# Patient Record
Sex: Male | Born: 1965 | Hispanic: Yes | Marital: Married | Smoking: Never smoker
Health system: Southern US, Community
[De-identification: ages and names within clinical notes are randomized; demographics above are authoritative.]

## PROBLEM LIST (undated history)

## (undated) DIAGNOSIS — Z86010 Personal history of colonic polyps: Principal | ICD-10-CM

## (undated) DIAGNOSIS — T7840XA Allergy, unspecified, initial encounter: Secondary | ICD-10-CM

## (undated) DIAGNOSIS — J45909 Unspecified asthma, uncomplicated: Secondary | ICD-10-CM

## (undated) HISTORY — DX: Allergy, unspecified, initial encounter: T78.40XA

## (undated) HISTORY — DX: Unspecified asthma, uncomplicated: J45.909

## (undated) HISTORY — PX: TONSILLECTOMY: SUR1361

## (undated) HISTORY — DX: Personal history of colonic polyps: Z86.010

## (undated) HISTORY — PX: HERNIA REPAIR: SHX51

## (undated) HISTORY — PX: ANKLE SURGERY: SHX546

## (undated) HISTORY — PX: KNEE SURGERY: SHX244

---

## 2015-10-08 DIAGNOSIS — Z23 Encounter for immunization: Secondary | ICD-10-CM | POA: Diagnosis not present

## 2015-10-08 DIAGNOSIS — Z136 Encounter for screening for cardiovascular disorders: Secondary | ICD-10-CM | POA: Diagnosis not present

## 2015-10-08 DIAGNOSIS — Z125 Encounter for screening for malignant neoplasm of prostate: Secondary | ICD-10-CM | POA: Diagnosis not present

## 2015-10-08 DIAGNOSIS — Z0000 Encounter for general adult medical examination without abnormal findings: Secondary | ICD-10-CM | POA: Diagnosis not present

## 2015-12-17 DIAGNOSIS — Z23 Encounter for immunization: Secondary | ICD-10-CM | POA: Diagnosis not present

## 2016-03-11 DIAGNOSIS — M25552 Pain in left hip: Secondary | ICD-10-CM | POA: Diagnosis not present

## 2016-03-11 DIAGNOSIS — Z6831 Body mass index (BMI) 31.0-31.9, adult: Secondary | ICD-10-CM | POA: Diagnosis not present

## 2016-08-04 DIAGNOSIS — M7702 Medial epicondylitis, left elbow: Secondary | ICD-10-CM | POA: Diagnosis not present

## 2016-08-04 DIAGNOSIS — M7541 Impingement syndrome of right shoulder: Secondary | ICD-10-CM | POA: Diagnosis not present

## 2016-08-04 DIAGNOSIS — Z6831 Body mass index (BMI) 31.0-31.9, adult: Secondary | ICD-10-CM | POA: Diagnosis not present

## 2016-08-11 DIAGNOSIS — M9902 Segmental and somatic dysfunction of thoracic region: Secondary | ICD-10-CM | POA: Diagnosis not present

## 2016-08-11 DIAGNOSIS — M9907 Segmental and somatic dysfunction of upper extremity: Secondary | ICD-10-CM | POA: Diagnosis not present

## 2016-08-11 DIAGNOSIS — M9901 Segmental and somatic dysfunction of cervical region: Secondary | ICD-10-CM | POA: Diagnosis not present

## 2016-08-11 DIAGNOSIS — M7542 Impingement syndrome of left shoulder: Secondary | ICD-10-CM | POA: Diagnosis not present

## 2016-08-15 DIAGNOSIS — M7542 Impingement syndrome of left shoulder: Secondary | ICD-10-CM | POA: Diagnosis not present

## 2016-08-15 DIAGNOSIS — M9901 Segmental and somatic dysfunction of cervical region: Secondary | ICD-10-CM | POA: Diagnosis not present

## 2016-08-15 DIAGNOSIS — M9902 Segmental and somatic dysfunction of thoracic region: Secondary | ICD-10-CM | POA: Diagnosis not present

## 2016-08-15 DIAGNOSIS — M9907 Segmental and somatic dysfunction of upper extremity: Secondary | ICD-10-CM | POA: Diagnosis not present

## 2016-08-17 DIAGNOSIS — M7542 Impingement syndrome of left shoulder: Secondary | ICD-10-CM | POA: Diagnosis not present

## 2016-08-17 DIAGNOSIS — M9902 Segmental and somatic dysfunction of thoracic region: Secondary | ICD-10-CM | POA: Diagnosis not present

## 2016-08-17 DIAGNOSIS — M9901 Segmental and somatic dysfunction of cervical region: Secondary | ICD-10-CM | POA: Diagnosis not present

## 2016-08-17 DIAGNOSIS — M9907 Segmental and somatic dysfunction of upper extremity: Secondary | ICD-10-CM | POA: Diagnosis not present

## 2016-08-21 DIAGNOSIS — M7542 Impingement syndrome of left shoulder: Secondary | ICD-10-CM | POA: Diagnosis not present

## 2016-08-21 DIAGNOSIS — M9907 Segmental and somatic dysfunction of upper extremity: Secondary | ICD-10-CM | POA: Diagnosis not present

## 2016-08-21 DIAGNOSIS — M9901 Segmental and somatic dysfunction of cervical region: Secondary | ICD-10-CM | POA: Diagnosis not present

## 2016-08-21 DIAGNOSIS — M9902 Segmental and somatic dysfunction of thoracic region: Secondary | ICD-10-CM | POA: Diagnosis not present

## 2016-10-01 DIAGNOSIS — M9901 Segmental and somatic dysfunction of cervical region: Secondary | ICD-10-CM | POA: Diagnosis not present

## 2016-10-01 DIAGNOSIS — M9907 Segmental and somatic dysfunction of upper extremity: Secondary | ICD-10-CM | POA: Diagnosis not present

## 2016-10-01 DIAGNOSIS — M9902 Segmental and somatic dysfunction of thoracic region: Secondary | ICD-10-CM | POA: Diagnosis not present

## 2016-10-01 DIAGNOSIS — M7542 Impingement syndrome of left shoulder: Secondary | ICD-10-CM | POA: Diagnosis not present

## 2016-10-06 DIAGNOSIS — M9907 Segmental and somatic dysfunction of upper extremity: Secondary | ICD-10-CM | POA: Diagnosis not present

## 2016-10-06 DIAGNOSIS — M9902 Segmental and somatic dysfunction of thoracic region: Secondary | ICD-10-CM | POA: Diagnosis not present

## 2016-10-06 DIAGNOSIS — M9901 Segmental and somatic dysfunction of cervical region: Secondary | ICD-10-CM | POA: Diagnosis not present

## 2016-10-06 DIAGNOSIS — M7542 Impingement syndrome of left shoulder: Secondary | ICD-10-CM | POA: Diagnosis not present

## 2016-10-07 DIAGNOSIS — M9907 Segmental and somatic dysfunction of upper extremity: Secondary | ICD-10-CM | POA: Diagnosis not present

## 2016-10-07 DIAGNOSIS — M9902 Segmental and somatic dysfunction of thoracic region: Secondary | ICD-10-CM | POA: Diagnosis not present

## 2016-10-07 DIAGNOSIS — M7542 Impingement syndrome of left shoulder: Secondary | ICD-10-CM | POA: Diagnosis not present

## 2016-10-07 DIAGNOSIS — M9901 Segmental and somatic dysfunction of cervical region: Secondary | ICD-10-CM | POA: Diagnosis not present

## 2017-02-23 DIAGNOSIS — Z125 Encounter for screening for malignant neoplasm of prostate: Secondary | ICD-10-CM | POA: Diagnosis not present

## 2017-02-23 DIAGNOSIS — Z114 Encounter for screening for human immunodeficiency virus [HIV]: Secondary | ICD-10-CM | POA: Diagnosis not present

## 2017-02-23 DIAGNOSIS — Z0000 Encounter for general adult medical examination without abnormal findings: Secondary | ICD-10-CM | POA: Diagnosis not present

## 2017-02-23 DIAGNOSIS — Z23 Encounter for immunization: Secondary | ICD-10-CM | POA: Diagnosis not present

## 2017-02-23 DIAGNOSIS — Z1329 Encounter for screening for other suspected endocrine disorder: Secondary | ICD-10-CM | POA: Diagnosis not present

## 2017-02-23 DIAGNOSIS — Z13220 Encounter for screening for lipoid disorders: Secondary | ICD-10-CM | POA: Diagnosis not present

## 2017-02-23 DIAGNOSIS — Z6832 Body mass index (BMI) 32.0-32.9, adult: Secondary | ICD-10-CM | POA: Diagnosis not present

## 2017-02-23 DIAGNOSIS — Z1211 Encounter for screening for malignant neoplasm of colon: Secondary | ICD-10-CM | POA: Diagnosis not present

## 2017-04-08 ENCOUNTER — Encounter: Payer: Self-pay | Admitting: Internal Medicine

## 2017-06-10 ENCOUNTER — Encounter: Payer: Self-pay | Admitting: Internal Medicine

## 2017-06-30 ENCOUNTER — Encounter: Payer: Self-pay | Admitting: *Deleted

## 2017-07-15 ENCOUNTER — Telehealth: Payer: Self-pay | Admitting: *Deleted

## 2017-07-15 NOTE — Telephone Encounter (Signed)
Called patient and he had forgotten about his Pre visit appointment this morning.  I was going to reschedule him but he wanted to call back in 2 hours to reschedule.  He will need to reschedule pre visit appointment before April 18th or both appointments will need to be rescheduled.  If patient has not called back before 5 pm today, I will cancel both appointments.  I gave him [phone] number to call.  B.Lorianna Spadaccini, CMA  PV

## 2017-07-16 ENCOUNTER — Encounter: Payer: Self-pay | Admitting: Family Medicine

## 2017-07-29 ENCOUNTER — Encounter: Payer: Self-pay | Admitting: Internal Medicine

## 2018-02-23 ENCOUNTER — Encounter: Payer: Self-pay | Admitting: Internal Medicine

## 2018-03-09 ENCOUNTER — Ambulatory Visit (AMBULATORY_SURGERY_CENTER): Payer: Self-pay

## 2018-03-09 VITALS — Ht 70.0 in | Wt 228.0 lb

## 2018-03-09 DIAGNOSIS — Z1211 Encounter for screening for malignant neoplasm of colon: Secondary | ICD-10-CM

## 2018-03-09 NOTE — Progress Notes (Signed)
Denies allergies to eggs or soy products. Denies complication of anesthesia or sedation. Denies use of weight loss medication. Denies use of O2.   Emmi instructions declined.  

## 2018-03-14 ENCOUNTER — Encounter: Payer: Self-pay | Admitting: Internal Medicine

## 2018-03-28 ENCOUNTER — Encounter: Payer: Self-pay | Admitting: Internal Medicine

## 2018-03-28 ENCOUNTER — Ambulatory Visit (AMBULATORY_SURGERY_CENTER): Payer: BLUE CROSS/BLUE SHIELD | Admitting: Internal Medicine

## 2018-03-28 VITALS — BP 109/76 | HR 58 | Temp 98.2°F | Resp 16 | Ht 70.0 in | Wt 225.0 lb

## 2018-03-28 DIAGNOSIS — Z1211 Encounter for screening for malignant neoplasm of colon: Secondary | ICD-10-CM | POA: Diagnosis not present

## 2018-03-28 DIAGNOSIS — D124 Benign neoplasm of descending colon: Secondary | ICD-10-CM

## 2018-03-28 MED ORDER — SODIUM CHLORIDE 0.9 % IV SOLN: 500.0000 mL | Freq: Once | INTRAVENOUS | Status: DC

## 2018-03-28 NOTE — Progress Notes (Signed)
Called to room to assist during endoscopic procedure.  Patient ID and intended procedure confirmed with present staff. Received instructions for my participation in the procedure from the performing physician.  

## 2018-03-28 NOTE — Op Note (Addendum)
[place] Patient Name: Edward Weiss Procedure Date: 03/28/2018 2:43 PM MRN: [id number] Endoscopist: Gatha Mayer , MD Age: 52 Referring MD:  Date of Birth: Feb 27, 1966 Gender: Male Account #: [id number] Procedure:                Colonoscopy Indications:              Screening for colorectal malignant neoplasm, This                            is the patient's first colonoscopy Medicines:                Propofol per Anesthesia, Monitored Anesthesia Care Procedure:                Pre-Anesthesia Assessment:                           - Prior to the procedure, a History and Physical                            was performed, and patient medications and                            allergies were reviewed. The patient's tolerance of                            previous anesthesia was also reviewed. The risks                            and benefits of the procedure and the sedation                            options and risks were discussed with the patient.                            All questions were answered, and informed consent                            was obtained. Prior Anticoagulants: The patient has                            taken no previous anticoagulant or antiplatelet                            agents. ASA Grade Assessment: I - A normal, healthy                            patient. After reviewing the risks and benefits,                            the patient was deemed in satisfactory condition to                            undergo the procedure.  After obtaining informed consent, the colonoscope                            was passed under direct vision. Throughout the                            procedure, the patient's blood pressure, pulse, and                            oxygen saturations were monitored continuously. The                            Colonoscope was introduced through the anus and                            advanced to the  the cecum, identified by                            appendiceal orifice and ileocecal valve. The                            colonoscopy was performed without difficulty. The                            patient tolerated the procedure well. The quality                            of the bowel preparation was good. The appendiceal                            orifice and the rectum were photographed. The bowel                            preparation used was Miralax. Scope In: 2:50:33 PM Scope Out: 3:09:08 PM Scope Withdrawal Time: 0 hours 14 minutes 25 seconds  Total Procedure Duration: 0 hours 18 minutes 35 seconds  Findings:                 The perianal and digital rectal examinations were                            normal. Pertinent negatives include normal prostate                            (size, shape, and consistency).                           Two sessile polyps were found in the descending                            colon. The polyps were diminutive in size. These                            polyps were removed with a cold snare. Resection  and retrieval were complete. Verification of                            patient identification for the specimen was done.                            Estimated blood loss was minimal.                           The exam was otherwise without abnormality on                            direct and retroflexion views. Complications:            No immediate complications. Estimated Blood Loss:     Estimated blood loss was minimal. Impression:               - Two diminutive polyps in the descending colon,                            removed with a cold snare. Resected and retrieved.                           - The examination was otherwise normal on direct                            and retroflexion views. Recommendation:           - Patient has a contact number available for                            emergencies. The signs and  symptoms of potential                            delayed complications were discussed with the                            patient. Return to normal activities tomorrow.                            Written discharge instructions were provided to the                            patient.                           - Resume previous diet.                           - Continue present medications.                           - Repeat colonoscopy is recommended. The                            colonoscopy date will be determined after pathology  results from today's exam become available for                            review. Gatha Mayer, MD 03/28/2018 3:13:45 PM This report has been signed electronically.

## 2018-03-28 NOTE — Patient Instructions (Addendum)
   I found and removed 2 tiny polyps.  I will let you know pathology results and when to have another routine colonoscopy by mail and/or My Chart.  I appreciate the opportunity to care for you. Gatha Mayer, MD, [facility]  Polyp handout given to patient.  YOU HAD AN ENDOSCOPIC PROCEDURE TODAY AT [facility]:   Refer to the procedure report that was given to you for any specific questions about what was found during the examination.  If the procedure report does not answer your questions, please call your gastroenterologist to clarify.  If you requested that your care partner not be given the details of your procedure findings, then the procedure report has been included in a sealed envelope for you to review at your convenience later.  YOU SHOULD EXPECT: Some feelings of bloating in the abdomen. Passage of more gas than usual.  Walking can help get rid of the air that was put into your GI tract during the procedure and reduce the bloating. If you had a lower endoscopy (such as a colonoscopy or flexible sigmoidoscopy) you may notice spotting of blood in your stool or on the toilet paper. If you underwent a bowel prep for your procedure, you may not have a normal bowel movement for a few days.  Please Note:  You might notice some irritation and congestion in your nose or some drainage.  This is from the oxygen used during your procedure.  There is no need for concern and it should clear up in a day or so.  SYMPTOMS TO REPORT IMMEDIATELY:   Following lower endoscopy (colonoscopy or flexible sigmoidoscopy):  Excessive amounts of blood in the stool  Significant tenderness or worsening of abdominal pains  Swelling of the abdomen that is new, acute  Fever of 100F or higher  For urgent or emergent issues, a gastroenterologist can be reached at any hour by calling [phone].   DIET:  We do recommend a small meal at first, but then you may proceed to your regular diet.   Drink plenty of fluids but you should avoid alcoholic beverages for 24 hours.  ACTIVITY:  You should plan to take it easy for the rest of today and you should NOT DRIVE or use heavy machinery until tomorrow (because of the sedation medicines used during the test).    FOLLOW UP: Our staff will call the number listed on your records the next business day following your procedure to check on you and address any questions or concerns that you may have regarding the information given to you following your procedure. If we do not reach you, we will leave a message.  However, if you are feeling well and you are not experiencing any problems, there is no need to return our call.  We will assume that you have returned to your regular daily activities without incident.  If any biopsies were taken you will be contacted by phone or by letter within the next 1-3 weeks.  Please call us at [phone] if you have not heard about the biopsies in 3 weeks.    SIGNATURES/CONFIDENTIALITY: You and/or your care partner have signed paperwork which will be entered into your electronic medical record.  These signatures attest to the fact that that the information above on your After Visit Summary has been reviewed and is understood.  Full responsibility of the confidentiality of this discharge information lies with you and/or your care-partner.

## 2018-03-28 NOTE — Progress Notes (Signed)
Report given to PACU, vss 

## 2018-03-29 ENCOUNTER — Telehealth: Payer: Self-pay

## 2018-03-29 ENCOUNTER — Telehealth: Payer: Self-pay | Admitting: *Deleted

## 2018-03-29 NOTE — Telephone Encounter (Signed)
  Follow up Call-  Call back number 03/28/2018  Post procedure Call Back phone  # [phone]  Permission to leave phone message Yes     Patient questions:  Do you have a fever, pain , or abdominal swelling? No. Pain Score  0 *  Have you tolerated food without any problems? Yes.    Have you been able to return to your normal activities? Yes.    Do you have any questions about your discharge instructions: Diet   No. Medications  No. Follow up visit  No.  Do you have questions or concerns about your Care? No.  Actions: * If pain score is 4 or above: No action needed, pain <4.

## 2018-03-29 NOTE — Telephone Encounter (Signed)
  Follow up Call-  Call back number 03/28/2018  Post procedure Call Back phone  # [phone]  Permission to leave phone message Yes     Patient questions:  Message left to call us if necessary.

## 2018-04-01 ENCOUNTER — Encounter: Payer: Self-pay | Admitting: Internal Medicine

## 2018-04-01 DIAGNOSIS — Z86010 Personal history of colonic polyps: Secondary | ICD-10-CM

## 2018-04-01 DIAGNOSIS — Z860101 Personal history of adenomatous and serrated colon polyps: Secondary | ICD-10-CM

## 2018-04-01 HISTORY — DX: Personal history of colonic polyps: Z86.010

## 2018-04-01 HISTORY — DX: Personal history of adenomatous and serrated colon polyps: Z86.0101

## 2018-04-01 NOTE — Progress Notes (Signed)
Diminutive adenoma and a mucosal polyp Recall 7 years 04/2018

## 2018-04-01 NOTE — Progress Notes (Signed)
Correction:  Recall 7 years 04/2025

## 2018-05-05 DIAGNOSIS — J310 Chronic rhinitis: Secondary | ICD-10-CM | POA: Diagnosis not present

## 2018-05-05 DIAGNOSIS — Z91018 Allergy to other foods: Secondary | ICD-10-CM | POA: Diagnosis not present

## 2018-06-11 DIAGNOSIS — J302 Other seasonal allergic rhinitis: Secondary | ICD-10-CM | POA: Diagnosis not present

## 2018-06-11 DIAGNOSIS — J069 Acute upper respiratory infection, unspecified: Secondary | ICD-10-CM | POA: Diagnosis not present

## 2018-06-11 DIAGNOSIS — J0140 Acute pansinusitis, unspecified: Secondary | ICD-10-CM | POA: Diagnosis not present

## 2019-03-03 ENCOUNTER — Other Ambulatory Visit: Payer: Self-pay | Admitting: Family Medicine

## 2019-03-03 DIAGNOSIS — N281 Cyst of kidney, acquired: Secondary | ICD-10-CM

## 2019-03-16 ENCOUNTER — Ambulatory Visit
Admission: RE | Admit: 2019-03-16 | Discharge: 2019-03-16 | Disposition: A | Discharge status: Home / Self Care | Payer: BLUE CROSS/BLUE SHIELD | Source: Ambulatory Visit | Attending: Family Medicine | Admitting: Family Medicine

## 2019-03-16 DIAGNOSIS — N281 Cyst of kidney, acquired: Secondary | ICD-10-CM

## 2019-03-16 DIAGNOSIS — Z8271 Family history of polycystic kidney: Secondary | ICD-10-CM | POA: Diagnosis not present

## 2019-12-25 DIAGNOSIS — M7542 Impingement syndrome of left shoulder: Secondary | ICD-10-CM | POA: Diagnosis not present

## 2019-12-25 DIAGNOSIS — M9907 Segmental and somatic dysfunction of upper extremity: Secondary | ICD-10-CM | POA: Diagnosis not present

## 2019-12-25 DIAGNOSIS — M9901 Segmental and somatic dysfunction of cervical region: Secondary | ICD-10-CM | POA: Diagnosis not present

## 2019-12-25 DIAGNOSIS — M9902 Segmental and somatic dysfunction of thoracic region: Secondary | ICD-10-CM | POA: Diagnosis not present

## 2019-12-28 DIAGNOSIS — M9901 Segmental and somatic dysfunction of cervical region: Secondary | ICD-10-CM | POA: Diagnosis not present

## 2019-12-28 DIAGNOSIS — M9902 Segmental and somatic dysfunction of thoracic region: Secondary | ICD-10-CM | POA: Diagnosis not present

## 2019-12-28 DIAGNOSIS — M9907 Segmental and somatic dysfunction of upper extremity: Secondary | ICD-10-CM | POA: Diagnosis not present

## 2019-12-28 DIAGNOSIS — M7542 Impingement syndrome of left shoulder: Secondary | ICD-10-CM | POA: Diagnosis not present

## 2020-01-02 DIAGNOSIS — M9901 Segmental and somatic dysfunction of cervical region: Secondary | ICD-10-CM | POA: Diagnosis not present

## 2020-01-02 DIAGNOSIS — M9907 Segmental and somatic dysfunction of upper extremity: Secondary | ICD-10-CM | POA: Diagnosis not present

## 2020-01-02 DIAGNOSIS — M7542 Impingement syndrome of left shoulder: Secondary | ICD-10-CM | POA: Diagnosis not present

## 2020-01-02 DIAGNOSIS — M9902 Segmental and somatic dysfunction of thoracic region: Secondary | ICD-10-CM | POA: Diagnosis not present

## 2020-01-03 DIAGNOSIS — M9901 Segmental and somatic dysfunction of cervical region: Secondary | ICD-10-CM | POA: Diagnosis not present

## 2020-01-03 DIAGNOSIS — M9907 Segmental and somatic dysfunction of upper extremity: Secondary | ICD-10-CM | POA: Diagnosis not present

## 2020-01-03 DIAGNOSIS — M9902 Segmental and somatic dysfunction of thoracic region: Secondary | ICD-10-CM | POA: Diagnosis not present

## 2020-01-03 DIAGNOSIS — M7542 Impingement syndrome of left shoulder: Secondary | ICD-10-CM | POA: Diagnosis not present

## 2020-01-04 DIAGNOSIS — M9901 Segmental and somatic dysfunction of cervical region: Secondary | ICD-10-CM | POA: Diagnosis not present

## 2020-01-04 DIAGNOSIS — M7542 Impingement syndrome of left shoulder: Secondary | ICD-10-CM | POA: Diagnosis not present

## 2020-01-04 DIAGNOSIS — M9902 Segmental and somatic dysfunction of thoracic region: Secondary | ICD-10-CM | POA: Diagnosis not present

## 2020-01-04 DIAGNOSIS — M9907 Segmental and somatic dysfunction of upper extremity: Secondary | ICD-10-CM | POA: Diagnosis not present

## 2020-03-28 DIAGNOSIS — H40023 Open angle with borderline findings, high risk, bilateral: Secondary | ICD-10-CM | POA: Diagnosis not present

## 2020-08-15 DIAGNOSIS — M7542 Impingement syndrome of left shoulder: Secondary | ICD-10-CM | POA: Diagnosis not present

## 2020-08-15 DIAGNOSIS — M9902 Segmental and somatic dysfunction of thoracic region: Secondary | ICD-10-CM | POA: Diagnosis not present

## 2020-08-15 DIAGNOSIS — M9901 Segmental and somatic dysfunction of cervical region: Secondary | ICD-10-CM | POA: Diagnosis not present

## 2020-08-15 DIAGNOSIS — M9907 Segmental and somatic dysfunction of upper extremity: Secondary | ICD-10-CM | POA: Diagnosis not present

## 2020-08-16 DIAGNOSIS — M7542 Impingement syndrome of left shoulder: Secondary | ICD-10-CM | POA: Diagnosis not present

## 2020-08-16 DIAGNOSIS — M9901 Segmental and somatic dysfunction of cervical region: Secondary | ICD-10-CM | POA: Diagnosis not present

## 2020-08-16 DIAGNOSIS — M9907 Segmental and somatic dysfunction of upper extremity: Secondary | ICD-10-CM | POA: Diagnosis not present

## 2020-08-16 DIAGNOSIS — M9902 Segmental and somatic dysfunction of thoracic region: Secondary | ICD-10-CM | POA: Diagnosis not present

## 2020-08-19 DIAGNOSIS — M9902 Segmental and somatic dysfunction of thoracic region: Secondary | ICD-10-CM | POA: Diagnosis not present

## 2020-08-19 DIAGNOSIS — M9901 Segmental and somatic dysfunction of cervical region: Secondary | ICD-10-CM | POA: Diagnosis not present

## 2020-08-19 DIAGNOSIS — M7542 Impingement syndrome of left shoulder: Secondary | ICD-10-CM | POA: Diagnosis not present

## 2020-08-19 DIAGNOSIS — M9907 Segmental and somatic dysfunction of upper extremity: Secondary | ICD-10-CM | POA: Diagnosis not present

## 2020-08-20 DIAGNOSIS — M7542 Impingement syndrome of left shoulder: Secondary | ICD-10-CM | POA: Diagnosis not present

## 2020-08-20 DIAGNOSIS — M9902 Segmental and somatic dysfunction of thoracic region: Secondary | ICD-10-CM | POA: Diagnosis not present

## 2020-08-20 DIAGNOSIS — M9901 Segmental and somatic dysfunction of cervical region: Secondary | ICD-10-CM | POA: Diagnosis not present

## 2020-08-20 DIAGNOSIS — M9907 Segmental and somatic dysfunction of upper extremity: Secondary | ICD-10-CM | POA: Diagnosis not present

## 2020-08-22 DIAGNOSIS — M9901 Segmental and somatic dysfunction of cervical region: Secondary | ICD-10-CM | POA: Diagnosis not present

## 2020-08-22 DIAGNOSIS — M9907 Segmental and somatic dysfunction of upper extremity: Secondary | ICD-10-CM | POA: Diagnosis not present

## 2020-08-22 DIAGNOSIS — M50322 Other cervical disc degeneration at C5-C6 level: Secondary | ICD-10-CM | POA: Diagnosis not present

## 2020-08-22 DIAGNOSIS — M7542 Impingement syndrome of left shoulder: Secondary | ICD-10-CM | POA: Diagnosis not present

## 2020-08-22 DIAGNOSIS — M9902 Segmental and somatic dysfunction of thoracic region: Secondary | ICD-10-CM | POA: Diagnosis not present

## 2020-08-22 DIAGNOSIS — M4802 Spinal stenosis, cervical region: Secondary | ICD-10-CM | POA: Diagnosis not present

## 2020-08-24 DIAGNOSIS — R059 Cough, unspecified: Secondary | ICD-10-CM | POA: Diagnosis not present

## 2020-08-24 DIAGNOSIS — Z20822 Contact with and (suspected) exposure to covid-19: Secondary | ICD-10-CM | POA: Diagnosis not present

## 2020-08-24 DIAGNOSIS — J302 Other seasonal allergic rhinitis: Secondary | ICD-10-CM | POA: Diagnosis not present

## 2020-08-24 DIAGNOSIS — J40 Bronchitis, not specified as acute or chronic: Secondary | ICD-10-CM | POA: Diagnosis not present

## 2020-08-24 DIAGNOSIS — R509 Fever, unspecified: Secondary | ICD-10-CM | POA: Diagnosis not present

## 2020-08-24 DIAGNOSIS — J329 Chronic sinusitis, unspecified: Secondary | ICD-10-CM | POA: Diagnosis not present

## 2020-08-24 DIAGNOSIS — Z8709 Personal history of other diseases of the respiratory system: Secondary | ICD-10-CM | POA: Diagnosis not present

## 2020-08-30 DIAGNOSIS — M542 Cervicalgia: Secondary | ICD-10-CM | POA: Diagnosis not present

## 2020-08-30 DIAGNOSIS — M50123 Cervical disc disorder at C6-C7 level with radiculopathy: Secondary | ICD-10-CM | POA: Diagnosis not present

## 2020-08-30 DIAGNOSIS — M4302 Spondylolysis, cervical region: Secondary | ICD-10-CM | POA: Diagnosis not present

## 2020-09-02 DIAGNOSIS — M9902 Segmental and somatic dysfunction of thoracic region: Secondary | ICD-10-CM | POA: Diagnosis not present

## 2020-09-02 DIAGNOSIS — M9901 Segmental and somatic dysfunction of cervical region: Secondary | ICD-10-CM | POA: Diagnosis not present

## 2020-09-02 DIAGNOSIS — M7542 Impingement syndrome of left shoulder: Secondary | ICD-10-CM | POA: Diagnosis not present

## 2020-09-02 DIAGNOSIS — M9907 Segmental and somatic dysfunction of upper extremity: Secondary | ICD-10-CM | POA: Diagnosis not present

## 2020-09-11 DIAGNOSIS — M5412 Radiculopathy, cervical region: Secondary | ICD-10-CM | POA: Diagnosis not present

## 2020-09-11 DIAGNOSIS — F331 Major depressive disorder, recurrent, moderate: Secondary | ICD-10-CM | POA: Diagnosis not present

## 2020-09-11 DIAGNOSIS — G4700 Insomnia, unspecified: Secondary | ICD-10-CM | POA: Diagnosis not present

## 2020-09-11 DIAGNOSIS — F411 Generalized anxiety disorder: Secondary | ICD-10-CM | POA: Diagnosis not present

## 2020-09-13 DIAGNOSIS — M9908 Segmental and somatic dysfunction of rib cage: Secondary | ICD-10-CM | POA: Diagnosis not present

## 2020-09-13 DIAGNOSIS — M9901 Segmental and somatic dysfunction of cervical region: Secondary | ICD-10-CM | POA: Diagnosis not present

## 2020-09-13 DIAGNOSIS — M4302 Spondylolysis, cervical region: Secondary | ICD-10-CM | POA: Diagnosis not present

## 2020-09-13 DIAGNOSIS — M9902 Segmental and somatic dysfunction of thoracic region: Secondary | ICD-10-CM | POA: Diagnosis not present

## 2020-09-23 DIAGNOSIS — M9908 Segmental and somatic dysfunction of rib cage: Secondary | ICD-10-CM | POA: Diagnosis not present

## 2020-09-23 DIAGNOSIS — M9902 Segmental and somatic dysfunction of thoracic region: Secondary | ICD-10-CM | POA: Diagnosis not present

## 2020-09-23 DIAGNOSIS — M4302 Spondylolysis, cervical region: Secondary | ICD-10-CM | POA: Diagnosis not present

## 2020-09-23 DIAGNOSIS — M9901 Segmental and somatic dysfunction of cervical region: Secondary | ICD-10-CM | POA: Diagnosis not present

## 2020-09-30 DIAGNOSIS — M9901 Segmental and somatic dysfunction of cervical region: Secondary | ICD-10-CM | POA: Diagnosis not present

## 2020-09-30 DIAGNOSIS — M542 Cervicalgia: Secondary | ICD-10-CM | POA: Diagnosis not present

## 2020-09-30 DIAGNOSIS — M4302 Spondylolysis, cervical region: Secondary | ICD-10-CM | POA: Diagnosis not present

## 2020-09-30 DIAGNOSIS — M50123 Cervical disc disorder at C6-C7 level with radiculopathy: Secondary | ICD-10-CM | POA: Diagnosis not present

## 2020-10-10 DIAGNOSIS — G4700 Insomnia, unspecified: Secondary | ICD-10-CM | POA: Diagnosis not present

## 2020-10-10 DIAGNOSIS — U071 COVID-19: Secondary | ICD-10-CM | POA: Diagnosis not present

## 2020-10-10 DIAGNOSIS — F411 Generalized anxiety disorder: Secondary | ICD-10-CM | POA: Diagnosis not present

## 2020-10-10 DIAGNOSIS — F331 Major depressive disorder, recurrent, moderate: Secondary | ICD-10-CM | POA: Diagnosis not present

## 2020-11-25 DIAGNOSIS — Z125 Encounter for screening for malignant neoplasm of prostate: Secondary | ICD-10-CM | POA: Diagnosis not present

## 2020-11-25 DIAGNOSIS — Z0000 Encounter for general adult medical examination without abnormal findings: Secondary | ICD-10-CM | POA: Diagnosis not present

## 2020-11-27 DIAGNOSIS — M7542 Impingement syndrome of left shoulder: Secondary | ICD-10-CM | POA: Diagnosis not present

## 2020-11-27 DIAGNOSIS — M7918 Myalgia, other site: Secondary | ICD-10-CM | POA: Diagnosis not present

## 2020-11-27 DIAGNOSIS — J309 Allergic rhinitis, unspecified: Secondary | ICD-10-CM | POA: Diagnosis not present

## 2020-11-27 DIAGNOSIS — Z0000 Encounter for general adult medical examination without abnormal findings: Secondary | ICD-10-CM | POA: Diagnosis not present

## 2020-11-27 DIAGNOSIS — M5412 Radiculopathy, cervical region: Secondary | ICD-10-CM | POA: Diagnosis not present

## 2020-11-27 DIAGNOSIS — Q649 Congenital malformation of urinary system, unspecified: Secondary | ICD-10-CM | POA: Diagnosis not present

## 2020-12-03 DIAGNOSIS — S29012A Strain of muscle and tendon of back wall of thorax, initial encounter: Secondary | ICD-10-CM | POA: Diagnosis not present

## 2020-12-25 DIAGNOSIS — M9901 Segmental and somatic dysfunction of cervical region: Secondary | ICD-10-CM | POA: Diagnosis not present

## 2020-12-25 DIAGNOSIS — M9902 Segmental and somatic dysfunction of thoracic region: Secondary | ICD-10-CM | POA: Diagnosis not present

## 2020-12-25 DIAGNOSIS — M7711 Lateral epicondylitis, right elbow: Secondary | ICD-10-CM | POA: Diagnosis not present

## 2020-12-25 DIAGNOSIS — M9907 Segmental and somatic dysfunction of upper extremity: Secondary | ICD-10-CM | POA: Diagnosis not present

## 2020-12-25 DIAGNOSIS — M7542 Impingement syndrome of left shoulder: Secondary | ICD-10-CM | POA: Diagnosis not present

## 2020-12-30 DIAGNOSIS — M47812 Spondylosis without myelopathy or radiculopathy, cervical region: Secondary | ICD-10-CM | POA: Diagnosis not present

## 2020-12-30 DIAGNOSIS — M4802 Spinal stenosis, cervical region: Secondary | ICD-10-CM | POA: Diagnosis not present

## 2021-01-01 DIAGNOSIS — M9901 Segmental and somatic dysfunction of cervical region: Secondary | ICD-10-CM | POA: Diagnosis not present

## 2021-01-01 DIAGNOSIS — M9907 Segmental and somatic dysfunction of upper extremity: Secondary | ICD-10-CM | POA: Diagnosis not present

## 2021-01-01 DIAGNOSIS — M9902 Segmental and somatic dysfunction of thoracic region: Secondary | ICD-10-CM | POA: Diagnosis not present

## 2021-01-01 DIAGNOSIS — M7542 Impingement syndrome of left shoulder: Secondary | ICD-10-CM | POA: Diagnosis not present

## 2021-01-07 DIAGNOSIS — R5383 Other fatigue: Secondary | ICD-10-CM | POA: Diagnosis not present

## 2021-01-07 DIAGNOSIS — F331 Major depressive disorder, recurrent, moderate: Secondary | ICD-10-CM | POA: Diagnosis not present

## 2021-01-07 DIAGNOSIS — F411 Generalized anxiety disorder: Secondary | ICD-10-CM | POA: Diagnosis not present

## 2021-01-07 DIAGNOSIS — G4700 Insomnia, unspecified: Secondary | ICD-10-CM | POA: Diagnosis not present

## 2021-01-10 DIAGNOSIS — M5412 Radiculopathy, cervical region: Secondary | ICD-10-CM | POA: Diagnosis not present

## 2021-01-13 ENCOUNTER — Other Ambulatory Visit: Payer: Self-pay | Admitting: Orthopedic Surgery

## 2021-01-13 DIAGNOSIS — M5412 Radiculopathy, cervical region: Secondary | ICD-10-CM

## 2021-01-13 DIAGNOSIS — R030 Elevated blood-pressure reading, without diagnosis of hypertension: Secondary | ICD-10-CM | POA: Diagnosis not present

## 2021-01-13 DIAGNOSIS — Z6831 Body mass index (BMI) 31.0-31.9, adult: Secondary | ICD-10-CM | POA: Diagnosis not present

## 2021-01-13 DIAGNOSIS — M4722 Other spondylosis with radiculopathy, cervical region: Secondary | ICD-10-CM | POA: Diagnosis not present

## 2021-01-16 ENCOUNTER — Other Ambulatory Visit: Payer: Self-pay | Admitting: Orthopedic Surgery

## 2021-01-21 ENCOUNTER — Inpatient Hospital Stay: Admission: RE | Admit: 2021-01-21 | Payer: BC Managed Care – PPO | Source: Ambulatory Visit

## 2021-01-21 IMAGING — US US RENAL
1 series · 14 of 25 positions shown · non-contrast
Comparison: None.

CLINICAL DATA: 53-year-old male with family history of polycystic
kidney disease.

EXAM:
RENAL / URINARY TRACT ULTRASOUND COMPLETE

[Series 1: us renal · 0.23mm/px · 14 of 46 slices shown]
[im 1/46]
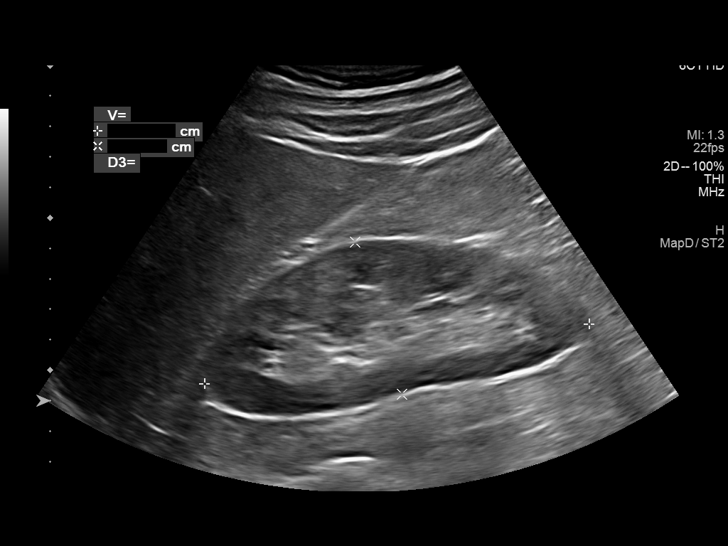
[im 4/46]
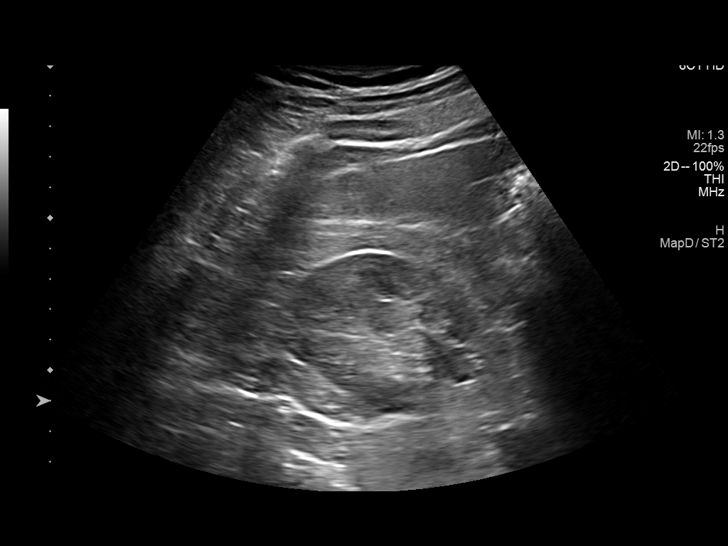
[im 8/46]
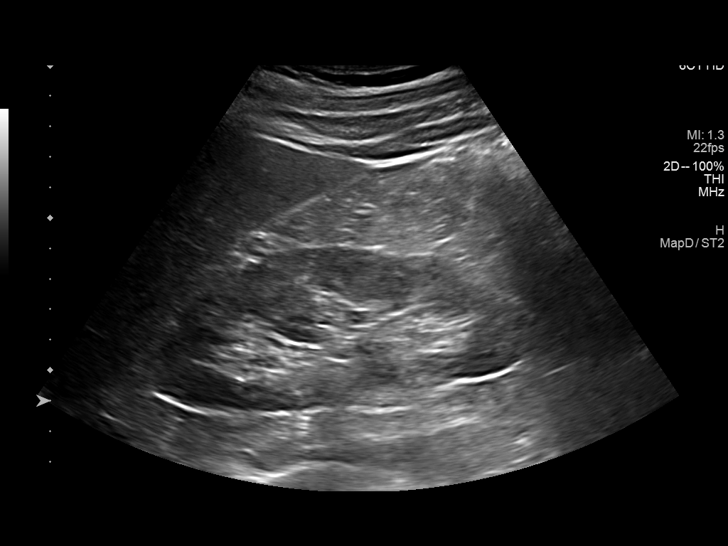
[im 12/46]
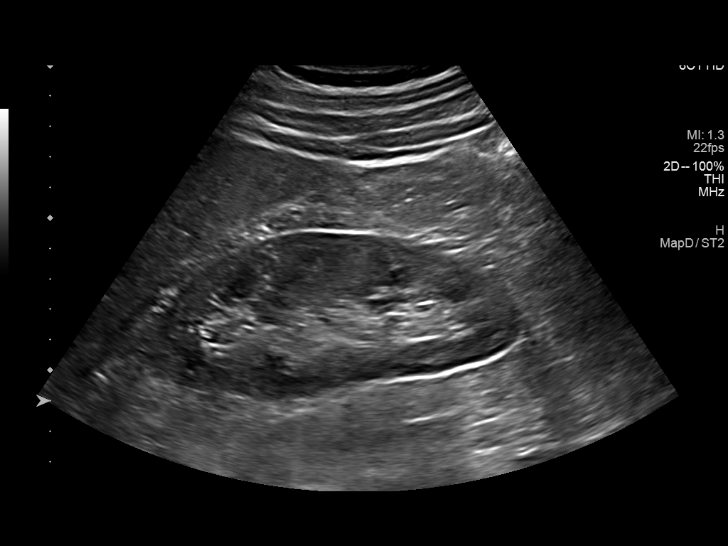
[im 16/46]
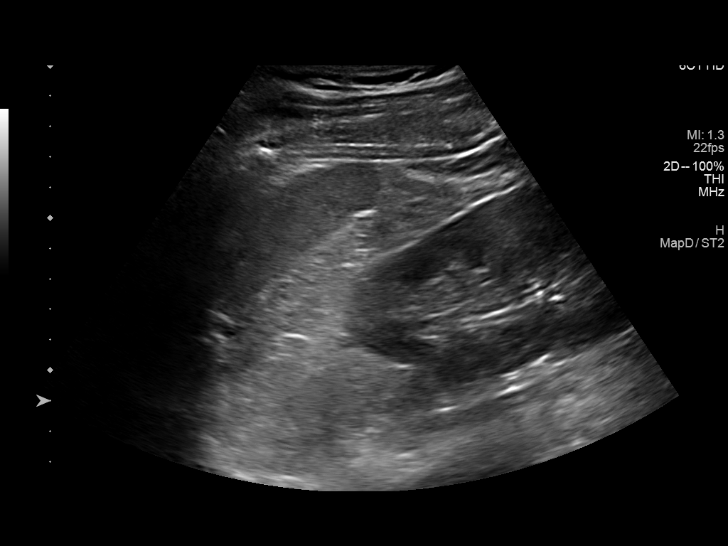
[im 17/46]
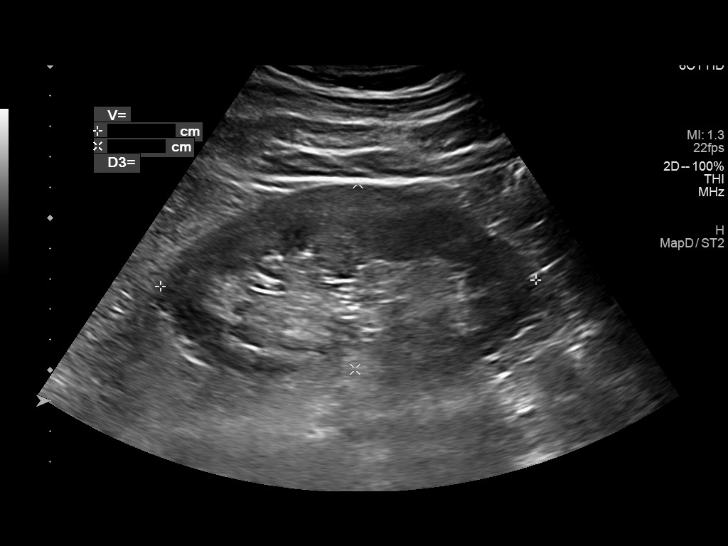
[im 21/46]
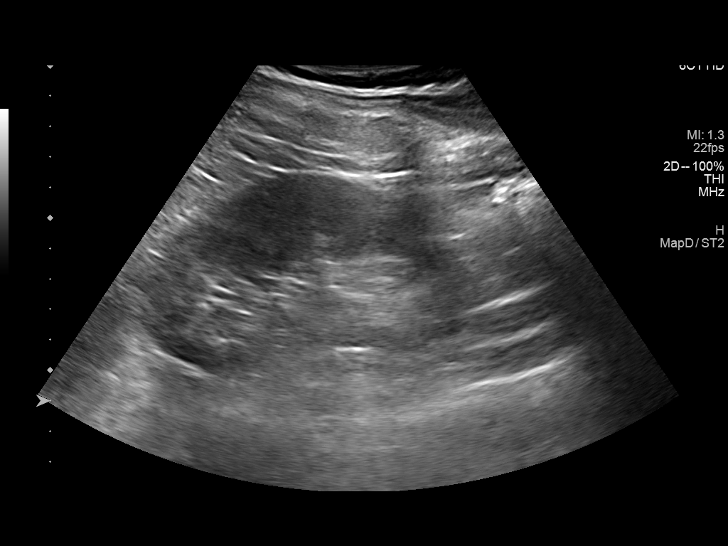
[im 25/46]
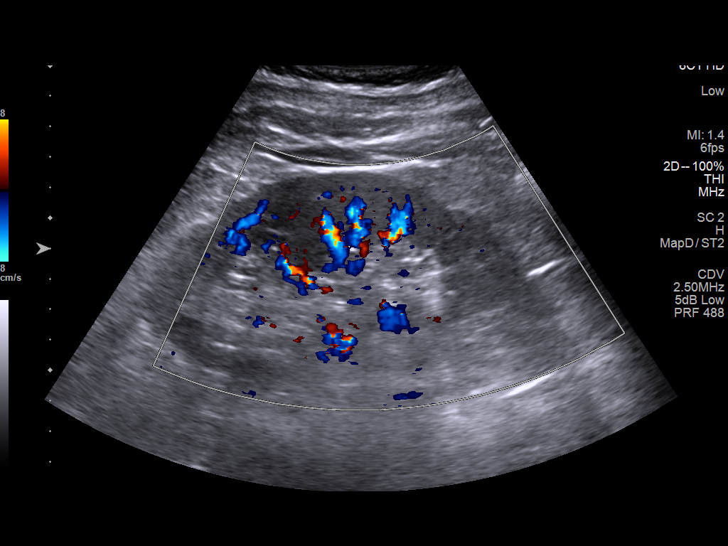
[im 29/46]
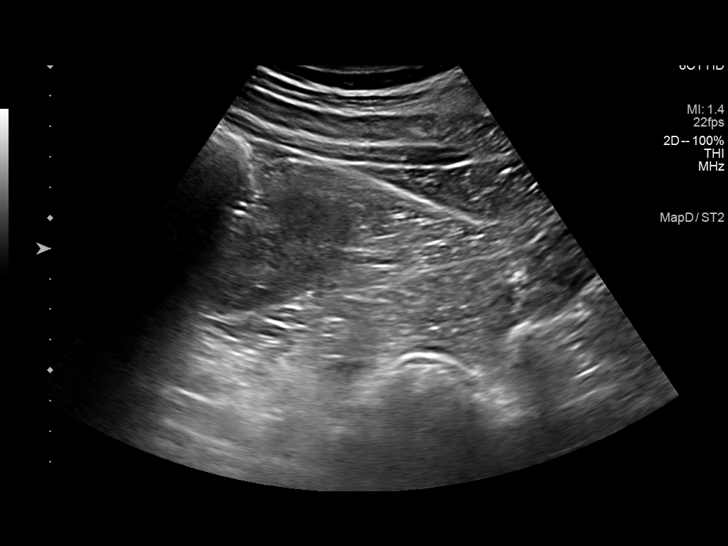
[im 31/46]
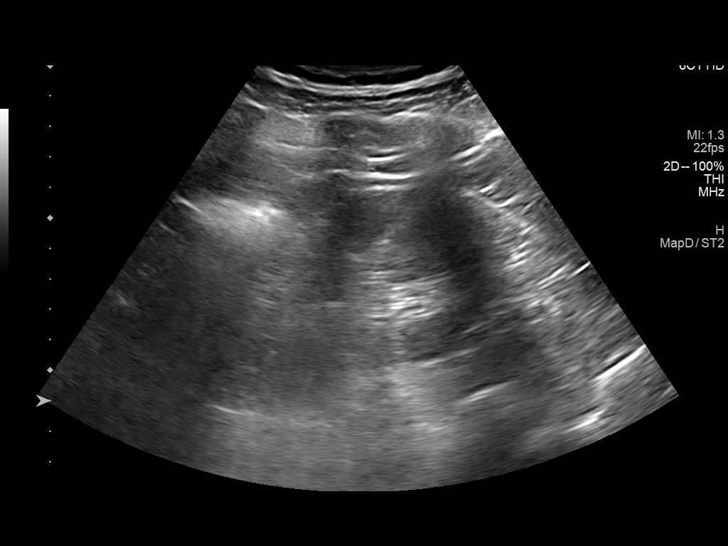
[im 34/46]
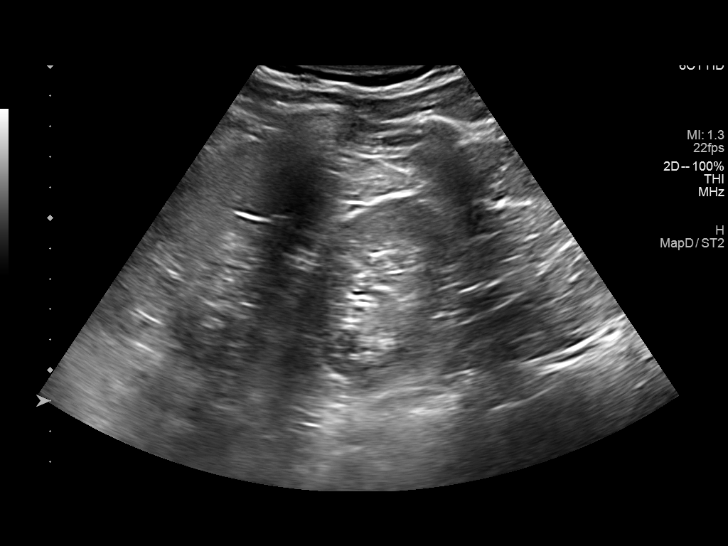
[im 38/46]
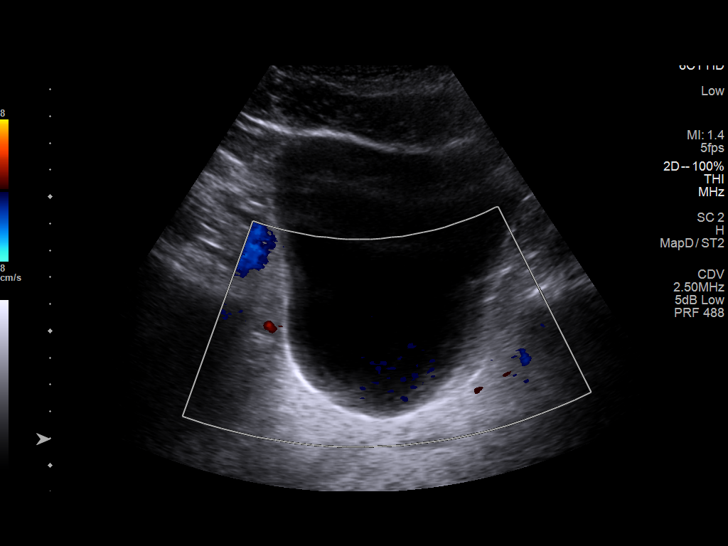
[im 42/46]
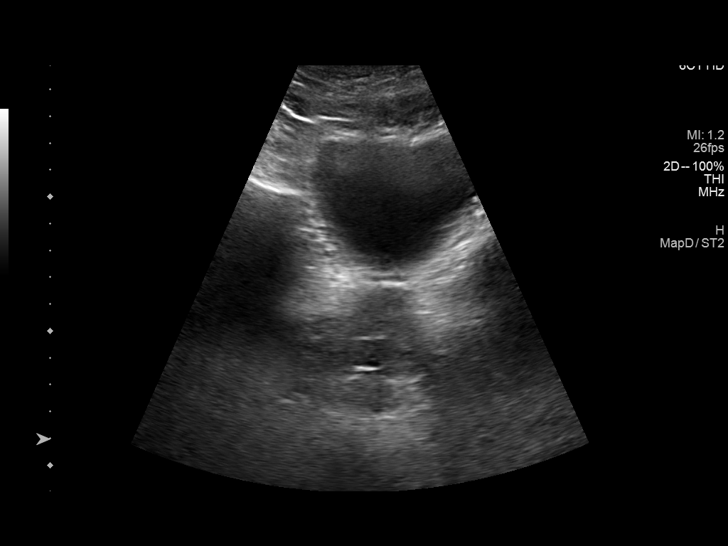
[im 46/46]
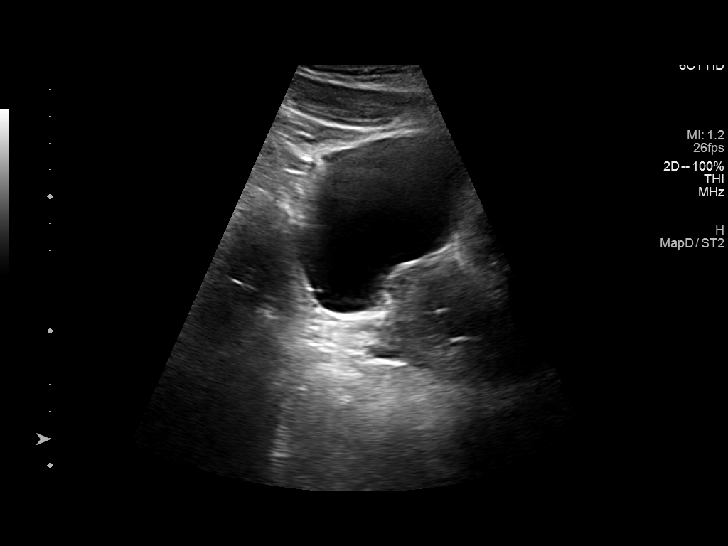

[14 of 25 positions shown; findings below may reference images not displayed]

FINDINGS: Right Kidney:

Renal measurements: 12.8 x 5.2 x 4.9 cm = volume: 172 mL. Normal
echogenicity. No hydronephrosis or shadowing stone.

Left Kidney:

Renal measurements: 12.3 x 6.1 x 6.2 cm = volume: 244 mL. Normal
echogenicity. No hydronephrosis or shadowing stone.

Bladder:

Appears normal for degree of bladder distention.

Other:

The prostate gland measures 3.9 x 3.9 x 5.1 cm.
IMPRESSION: Unremarkable renal ultrasound.

## 2021-01-30 DIAGNOSIS — F331 Major depressive disorder, recurrent, moderate: Secondary | ICD-10-CM | POA: Diagnosis not present

## 2021-01-30 DIAGNOSIS — M5412 Radiculopathy, cervical region: Secondary | ICD-10-CM | POA: Diagnosis not present

## 2021-01-30 DIAGNOSIS — F411 Generalized anxiety disorder: Secondary | ICD-10-CM | POA: Diagnosis not present

## 2021-01-30 DIAGNOSIS — G4700 Insomnia, unspecified: Secondary | ICD-10-CM | POA: Diagnosis not present

## 2021-02-03 NOTE — Pre-Procedure Instructions (Addendum)
Surgical Instructions    Your procedure is scheduled on Wednesday 02/12/21.   Report to [facility] Main Entrance "A" at 09:55 A.M., then check in with the Admitting office.  Call this number if you have problems the morning of surgery:  [phone]   If you have any questions prior to your surgery date call [phone]: Open Monday-Friday 8am-4pm    Remember:  Do not eat after midnight the night before your surgery  You may drink clear liquids until 08:55A.M. the morning of your surgery.   Clear liquids allowed are: Water, Non-Citrus Juices (without pulp), Carbonated Beverages, Clear Tea, Black Coffee ONLY (NO MILK, CREAM OR POWDERED CREAMER of any kind), and Gatorade  Patient Instructions  The night before surgery:  No food after midnight. ONLY clear liquids after midnight  The day of surgery (if you do NOT have diabetes):  Drink ONE (1) Pre-Surgery Clear Ensure by 08:55A.M. the morning of surgery. Drink in one sitting. Do not sip.  This drink was given to you during your hospital  pre-op appointment visit.  Nothing else to drink after completing the  Pre-Surgery Clear Ensure.        If you have questions, please contact your surgeon's office.     Take these medicines the morning of surgery with A SIP OF WATER   fluticasone (FLONASE)- If needed  montelukast (SINGULAIR)- If needed   As of today, STOP taking any Aspirin (unless otherwise instructed by your surgeon) Aleve, Naproxen, Ibuprofen, Motrin, Advil, Goody's, BC's, all herbal medications, fish oil, and all vitamins.     After your COVID test   You are not required to quarantine however you are required to wear a well-fitting mask when you are out and around people not in your household.  If your mask becomes wet or soiled, replace with a new one.  Wash your hands often with soap and water for 20 seconds or clean your hands with an alcohol-based hand sanitizer that contains at least 60% alcohol.  Do not share  personal items.  Notify your provider: if you are in close contact with someone who has COVID  or if you develop a fever of 100.4 or greater, sneezing, cough, sore throat, shortness of breath or body aches.             Do not wear jewelry or makeup Do not wear lotions, powders, perfumes/colognes, or deodorant. Do not shave 48 hours prior to surgery.  Men may shave face and neck. Do not bring valuables to the hospital. DO Not wear nail polish, gel polish, artificial nails, or any other type of covering on natural nails including finger and toenails. If patients have artificial nails, gel coating, etc. that need to be removed by a nail salon, please have this removed prior to surgery or surgery may need to be canceled/delayed if the surgeon/ anesthesia feels like the patient is unable to be adequately monitored.             [facility] is not responsible for any belongings or valuables.  Do NOT Smoke (Tobacco/Vaping)  24 hours prior to your procedure  If you use a CPAP at night, you may bring your mask for your overnight stay.   Contacts, glasses, hearing aids, dentures or partials may not be worn into surgery, please bring cases for these belongings   For patients admitted to the hospital, discharge time will be determined by your treatment team.   Patients discharged the day of surgery will not  be allowed to drive home, and someone needs to stay with them for 24 hours.  NO VISITORS WILL BE ALLOWED IN PRE-OP WHERE PATIENTS ARE PREPPED FOR SURGERY.  ONLY 1 SUPPORT PERSON MAY BE PRESENT IN THE WAITING ROOM WHILE YOU ARE IN SURGERY.  IF YOU ARE TO BE ADMITTED, ONCE YOU ARE IN YOUR ROOM YOU WILL BE ALLOWED TWO (2) VISITORS. 1 (ONE) VISITOR MAY STAY OVERNIGHT BUT MUST ARRIVE TO THE ROOM BY 8pm.  Minor children may have two parents present. Special consideration for safety and communication needs will be reviewed on a case by case basis.  Special instructions:    Oral Hygiene is also  important to reduce your risk of infection.  Remember - BRUSH YOUR TEETH THE MORNING OF SURGERY WITH YOUR REGULAR TOOTHPASTE   [facility]- Preparing For Surgery  Before surgery, you can play an important role. Because skin is not sterile, your skin needs to be as free of germs as possible. You can reduce the number of germs on your skin by washing with CHG (chlorahexidine gluconate) Soap before surgery.  CHG is an antiseptic cleaner which kills germs and bonds with the skin to continue killing germs even after washing.     Please do not use if you have an allergy to CHG or antibacterial soaps. If your skin becomes reddened/irritated stop using the CHG.  Do not shave (including legs and underarms) for at least 48 hours prior to first CHG shower. It is OK to shave your face.  Please follow these instructions carefully.     Shower the NIGHT BEFORE SURGERY and the MORNING OF SURGERY with CHG Soap.   If you chose to wash your hair, wash your hair first as usual with your normal shampoo. After you shampoo, rinse your hair and body thoroughly to remove the shampoo.  Then [employer] and genitals (private parts) with your normal soap and rinse thoroughly to remove soap.  After that Use CHG Soap as you would any other liquid soap. You can apply CHG directly to the skin and wash gently with a scrungie or a clean washcloth.   Apply the CHG Soap to your body ONLY FROM THE NECK DOWN.  Do not use on open wounds or open sores. Avoid contact with your eyes, ears, mouth and genitals (private parts). Wash Face and genitals (private parts)  with your normal soap.   Wash thoroughly, paying special attention to the area where your surgery will be performed.  Thoroughly rinse your body with warm water from the neck down.  DO NOT shower/wash with your normal soap after using and rinsing off the CHG Soap.  Pat yourself dry with a CLEAN TOWEL.  Wear CLEAN PAJAMAS to bed the night before surgery  Place CLEAN  SHEETS on your bed the night before your surgery  DO NOT SLEEP WITH PETS.   Day of Surgery:  Take a shower with CHG soap. Wear Clean/Comfortable clothing the morning of surgery Do not apply any deodorants/lotions.   Remember to brush your teeth WITH YOUR REGULAR TOOTHPASTE.   Please read over the following fact sheets that you were given.

## 2021-02-04 ENCOUNTER — Encounter (HOSPITAL_COMMUNITY): Payer: Self-pay

## 2021-02-04 ENCOUNTER — Encounter (HOSPITAL_COMMUNITY)
Admission: RE | Admit: 2021-02-04 | Discharge: 2021-02-04 | Disposition: A | Discharge status: Home / Self Care | Payer: BC Managed Care – PPO | Source: Ambulatory Visit | Attending: Orthopedic Surgery | Admitting: Orthopedic Surgery

## 2021-02-04 ENCOUNTER — Other Ambulatory Visit: Payer: Self-pay

## 2021-02-04 VITALS — BP 145/91 | HR 70 | Temp 98.2°F | Resp 18 | Ht 70.0 in | Wt 216.2 lb

## 2021-02-04 DIAGNOSIS — Z01812 Encounter for preprocedural laboratory examination: Secondary | ICD-10-CM | POA: Diagnosis not present

## 2021-02-04 DIAGNOSIS — Z01818 Encounter for other preprocedural examination: Secondary | ICD-10-CM

## 2021-02-04 LAB — CBC WITH DIFFERENTIAL/PLATELET
Abs Immature Granulocytes: 0.01 10*3/uL (ref 0.00–0.07)
Basophils Absolute: 0 10*3/uL (ref 0.0–0.1)
Basophils Relative: 1 %
Eosinophils Absolute: 0.1 10*3/uL (ref 0.0–0.5)
Eosinophils Relative: 2 %
HCT: 43.7 % (ref 39.0–52.0)
Hemoglobin: 14.1 g/dL (ref 13.0–17.0)
Immature Granulocytes: 0 %
Lymphocytes Relative: 26 %
Lymphs Abs: 1.4 10*3/uL (ref 0.7–4.0)
MCH: 27.3 pg (ref 26.0–34.0)
MCHC: 32.3 g/dL (ref 30.0–36.0)
MCV: 84.5 fL (ref 80.0–100.0)
Monocytes Absolute: 0.5 10*3/uL (ref 0.1–1.0)
Monocytes Relative: 8 %
Neutro Abs: 3.5 10*3/uL (ref 1.7–7.7)
Neutrophils Relative %: 63 %
Platelets: 256 10*3/uL (ref 150–400)
RBC: 5.17 MIL/uL (ref 4.22–5.81)
RDW: 13.5 % (ref 11.5–15.5)
WBC: 5.5 10*3/uL (ref 4.0–10.5)
nRBC: 0 % (ref 0.0–0.2)

## 2021-02-04 LAB — COMPREHENSIVE METABOLIC PANEL
ALT: 24 U/L (ref 0–44)
AST: 22 U/L (ref 15–41)
Albumin: 4 g/dL (ref 3.5–5.0)
Alkaline Phosphatase: 43 U/L (ref 38–126)
Anion gap: 7 (ref 5–15)
BUN: 18 mg/dL (ref 6–20)
CO2: 26 mmol/L (ref 22–32)
Calcium: 9.1 mg/dL (ref 8.9–10.3)
Chloride: 103 mmol/L (ref 98–111)
Creatinine, Ser: 1.05 mg/dL (ref 0.61–1.24)
GFR, Estimated: >60 mL/min (ref >60)
Glucose, Bld: 117 mg/dL — ABNORMAL HIGH (ref 70–99)
Potassium: 3.8 mmol/L (ref 3.5–5.1)
Sodium: 136 mmol/L (ref 135–145)
Total Bilirubin: 0.5 mg/dL (ref 0.3–1.2)
Total Protein: 6.8 g/dL (ref 6.5–8.1)

## 2021-02-04 LAB — URINALYSIS, ROUTINE W REFLEX MICROSCOPIC
Bilirubin Urine: NEGATIVE
Glucose, UA: NEGATIVE mg/dL
Hgb urine dipstick: NEGATIVE
Ketones, ur: 5 mg/dL — AB
Leukocytes,Ua: NEGATIVE
Nitrite: NEGATIVE
Protein, ur: NEGATIVE mg/dL
Specific Gravity, Urine: 1.021 (ref 1.005–1.030)
pH: 5 (ref 5.0–8.0)

## 2021-02-04 LAB — PROTIME-INR
INR: 1.1 (ref 0.8–1.2)
Prothrombin Time: 14 seconds (ref 11.4–15.2)

## 2021-02-04 LAB — SURGICAL PCR SCREEN
MRSA, PCR: NEGATIVE
Staphylococcus aureus: NEGATIVE

## 2021-02-04 LAB — TYPE AND SCREEN
ABO/RH(D): AB POS
Antibody Screen: NEGATIVE

## 2021-02-04 LAB — APTT: aPTT: 29 seconds (ref 24–36)

## 2021-02-04 NOTE — Progress Notes (Signed)
PCP - Dr. Deirdre Evener Cardiologist - denies  PPM/ICD - n/a Device Orders - n/a Rep Notified - n/a  Chest x-ray - n/a EKG - n/a Stress Test - n/a ECHO - n/a Cardiac Cath - n/a  Sleep Study - denies CPAP - n/a  Fasting Blood Sugar - n/a Checks Blood Sugar _____ times a day-n/a  Blood Thinner Instructions: n/a Aspirin Instructions: n/a  ERAS Protcol - Yes PRE-SURGERY Ensure or G2- Ensure  COVID TEST- Scheduled for 02/10/21. Patient is aware of date, time, and location.    Anesthesia review: No  Patient denies shortness of breath, fever, cough and chest pain at PAT appointment   All instructions explained to the patient, with a verbal understanding of the material. Patient agrees to go over the instructions while at home for a better understanding. Patient also instructed to self quarantine after being tested for COVID-19. The opportunity to ask questions was provided.

## 2021-02-07 DIAGNOSIS — M5412 Radiculopathy, cervical region: Secondary | ICD-10-CM | POA: Diagnosis not present

## 2021-02-10 ENCOUNTER — Other Ambulatory Visit (HOSPITAL_COMMUNITY)
Admission: RE | Admit: 2021-02-10 | Discharge: 2021-02-10 | Disposition: A | Discharge status: Home / Self Care | Payer: BC Managed Care – PPO | Source: Ambulatory Visit | Attending: Orthopedic Surgery | Admitting: Orthopedic Surgery

## 2021-02-10 DIAGNOSIS — Z79899 Other long term (current) drug therapy: Secondary | ICD-10-CM | POA: Diagnosis not present

## 2021-02-10 DIAGNOSIS — Z01812 Encounter for preprocedural laboratory examination: Secondary | ICD-10-CM | POA: Insufficient documentation

## 2021-02-10 DIAGNOSIS — Z20822 Contact with and (suspected) exposure to covid-19: Secondary | ICD-10-CM | POA: Insufficient documentation

## 2021-02-10 DIAGNOSIS — M5412 Radiculopathy, cervical region: Secondary | ICD-10-CM | POA: Diagnosis not present

## 2021-02-10 DIAGNOSIS — Z01818 Encounter for other preprocedural examination: Secondary | ICD-10-CM

## 2021-02-10 DIAGNOSIS — M4802 Spinal stenosis, cervical region: Secondary | ICD-10-CM | POA: Diagnosis not present

## 2021-02-10 DIAGNOSIS — J45909 Unspecified asthma, uncomplicated: Secondary | ICD-10-CM | POA: Diagnosis not present

## 2021-02-10 LAB — SARS CORONAVIRUS 2 (TAT 6-24 HRS): SARS Coronavirus 2: NEGATIVE

## 2021-02-12 ENCOUNTER — Ambulatory Visit (HOSPITAL_COMMUNITY): Payer: BC Managed Care – PPO | Admitting: Certified Registered Nurse Anesthetist

## 2021-02-12 ENCOUNTER — Other Ambulatory Visit: Payer: Self-pay

## 2021-02-12 ENCOUNTER — Ambulatory Visit (HOSPITAL_COMMUNITY): Payer: BC Managed Care – PPO

## 2021-02-12 ENCOUNTER — Encounter (HOSPITAL_COMMUNITY): Payer: Self-pay | Admitting: Orthopedic Surgery

## 2021-02-12 ENCOUNTER — Ambulatory Visit (HOSPITAL_COMMUNITY)
Admission: RE | Admit: 2021-02-12 | Discharge: 2021-02-12 | Disposition: A | Discharge status: Home / Self Care | Payer: BC Managed Care – PPO | Source: Ambulatory Visit | Attending: Orthopedic Surgery | Admitting: Orthopedic Surgery

## 2021-02-12 ENCOUNTER — Encounter (HOSPITAL_COMMUNITY)
Admission: RE | Disposition: A | Discharge status: Home / Self Care | Payer: Self-pay | Source: Ambulatory Visit | Attending: Orthopedic Surgery

## 2021-02-12 DIAGNOSIS — J45909 Unspecified asthma, uncomplicated: Secondary | ICD-10-CM | POA: Insufficient documentation

## 2021-02-12 DIAGNOSIS — Z79899 Other long term (current) drug therapy: Secondary | ICD-10-CM | POA: Diagnosis not present

## 2021-02-12 DIAGNOSIS — M5412 Radiculopathy, cervical region: Secondary | ICD-10-CM | POA: Insufficient documentation

## 2021-02-12 DIAGNOSIS — Z20822 Contact with and (suspected) exposure to covid-19: Secondary | ICD-10-CM | POA: Insufficient documentation

## 2021-02-12 DIAGNOSIS — M4322 Fusion of spine, cervical region: Secondary | ICD-10-CM | POA: Diagnosis not present

## 2021-02-12 DIAGNOSIS — Z86010 Personal history of colonic polyps: Secondary | ICD-10-CM | POA: Diagnosis not present

## 2021-02-12 DIAGNOSIS — Z419 Encounter for procedure for purposes other than remedying health state, unspecified: Secondary | ICD-10-CM

## 2021-02-12 DIAGNOSIS — M4802 Spinal stenosis, cervical region: Secondary | ICD-10-CM | POA: Diagnosis not present

## 2021-02-12 DIAGNOSIS — Z981 Arthrodesis status: Secondary | ICD-10-CM | POA: Diagnosis not present

## 2021-02-12 DIAGNOSIS — M4803 Spinal stenosis, cervicothoracic region: Secondary | ICD-10-CM | POA: Diagnosis not present

## 2021-02-12 HISTORY — PX: ANTERIOR CERVICAL DECOMP/DISCECTOMY FUSION: SHX1161

## 2021-02-12 LAB — ABO/RH: ABO/RH(D): AB POS

## 2021-02-12 SURGERY — ANTERIOR CERVICAL DECOMPRESSION/DISCECTOMY FUSION 1 LEVEL
Anesthesia: General | Site: Spine Cervical

## 2021-02-12 MED ORDER — FENTANYL CITRATE (PF) 100 MCG/2ML IJ SOLN
25.0000 ug | INTRAMUSCULAR | Status: DC | PRN
  Administered 2021-02-12 (×2): 50 ug via INTRAVENOUS

## 2021-02-12 MED ORDER — POVIDONE-IODINE 7.5 % EX SOLN: Freq: Once | CUTANEOUS | Status: DC

## 2021-02-12 MED ORDER — HYDROMORPHONE HCL 1 MG/ML IJ SOLN
INTRAMUSCULAR | Status: AC
  Filled 2021-02-12: qty 1

## 2021-02-12 MED ORDER — THROMBIN 20000 UNITS EX SOLR
CUTANEOUS | Status: AC
  Filled 2021-02-12: qty 20000

## 2021-02-12 MED ORDER — HYDROMORPHONE HCL 1 MG/ML IJ SOLN
0.2500 mg | INTRAMUSCULAR | Status: DC | PRN
  Administered 2021-02-12 (×2): 0.5 mg via INTRAVENOUS

## 2021-02-12 MED ORDER — BUPIVACAINE-EPINEPHRINE (PF) 0.25% -1:200000 IJ SOLN
INTRAMUSCULAR | Status: AC
  Filled 2021-02-12: qty 30

## 2021-02-12 MED ORDER — DEXAMETHASONE SODIUM PHOSPHATE 10 MG/ML IJ SOLN
INTRAMUSCULAR | Status: AC
  Filled 2021-02-12: qty 1

## 2021-02-12 MED ORDER — SCOPOLAMINE 1 MG/3DAYS TD PT72
MEDICATED_PATCH | TRANSDERMAL | Status: AC
  Filled 2021-02-12: qty 1

## 2021-02-12 MED ORDER — PHENYLEPHRINE HCL (PRESSORS) 10 MG/ML IV SOLN
INTRAVENOUS | Status: DC | PRN
  Administered 2021-02-12: 80 ug via INTRAVENOUS

## 2021-02-12 MED ORDER — PROPOFOL 1000 MG/100ML IV EMUL
INTRAVENOUS | Status: AC
  Filled 2021-02-12: qty 100

## 2021-02-12 MED ORDER — ACETAMINOPHEN 10 MG/ML IV SOLN: 1000.0000 mg | Freq: Once | INTRAVENOUS | Status: DC | PRN

## 2021-02-12 MED ORDER — LIDOCAINE 2% (20 MG/ML) 5 ML SYRINGE
INTRAMUSCULAR | Status: DC | PRN
  Administered 2021-02-12: 60 mg via INTRAVENOUS

## 2021-02-12 MED ORDER — CHLORHEXIDINE GLUCONATE 0.12 % MT SOLN
OROMUCOSAL | Status: AC
  Administered 2021-02-12: 15 mL via OROMUCOSAL
  Filled 2021-02-12: qty 15

## 2021-02-12 MED ORDER — KETAMINE HCL 10 MG/ML IJ SOLN
INTRAMUSCULAR | Status: DC | PRN
  Administered 2021-02-12: 20 mg via INTRAVENOUS
  Administered 2021-02-12: 10 mg via INTRAVENOUS

## 2021-02-12 MED ORDER — PHENYLEPHRINE HCL-NACL 20-0.9 MG/250ML-% IV SOLN
INTRAVENOUS | Status: AC
  Filled 2021-02-12: qty 250

## 2021-02-12 MED ORDER — MIDAZOLAM HCL 2 MG/2ML IJ SOLN
INTRAMUSCULAR | Status: AC
  Filled 2021-02-12: qty 2

## 2021-02-12 MED ORDER — CEFAZOLIN SODIUM-DEXTROSE 2-4 GM/100ML-% IV SOLN
INTRAVENOUS | Status: AC
  Filled 2021-02-12: qty 100

## 2021-02-12 MED ORDER — METHOCARBAMOL 500 MG PO TABS: 500.0000 mg | ORAL_TABLET | Freq: Four times a day (QID) | ORAL | 0 refills | Status: AC | PRN

## 2021-02-12 MED ORDER — AMISULPRIDE (ANTIEMETIC) 5 MG/2ML IV SOLN
10.0000 mg | Freq: Once | INTRAVENOUS | Status: AC
  Administered 2021-02-12: 10 mg via INTRAVENOUS

## 2021-02-12 MED ORDER — KETOROLAC TROMETHAMINE 30 MG/ML IJ SOLN
30.0000 mg | Freq: Once | INTRAMUSCULAR | Status: AC
  Administered 2021-02-12: 30 mg via INTRAVENOUS

## 2021-02-12 MED ORDER — PROPOFOL 10 MG/ML IV BOLUS
INTRAVENOUS | Status: DC | PRN
  Administered 2021-02-12: 200 mg via INTRAVENOUS

## 2021-02-12 MED ORDER — PHENYLEPHRINE 40 MCG/ML (10ML) SYRINGE FOR IV PUSH (FOR BLOOD PRESSURE SUPPORT)
PREFILLED_SYRINGE | INTRAVENOUS | Status: AC
  Filled 2021-02-12: qty 10

## 2021-02-12 MED ORDER — PHENYLEPHRINE HCL-NACL 20-0.9 MG/250ML-% IV SOLN
INTRAVENOUS | Status: DC | PRN
  Administered 2021-02-12: 30 ug/min via INTRAVENOUS

## 2021-02-12 MED ORDER — LACTATED RINGERS IV SOLN: INTRAVENOUS | Status: DC

## 2021-02-12 MED ORDER — BACITRACIN ZINC 500 UNIT/GM EX OINT
TOPICAL_OINTMENT | CUTANEOUS | Status: AC
  Filled 2021-02-12: qty 28.35

## 2021-02-12 MED ORDER — KETOROLAC TROMETHAMINE 30 MG/ML IJ SOLN
INTRAMUSCULAR | Status: AC
  Filled 2021-02-12: qty 1

## 2021-02-12 MED ORDER — FENTANYL CITRATE (PF) 100 MCG/2ML IJ SOLN
INTRAMUSCULAR | Status: AC
  Filled 2021-02-12: qty 2

## 2021-02-12 MED ORDER — SODIUM CHLORIDE 0.9 % IR SOLN
Status: DC | PRN
  Administered 2021-02-12: 1000 mL

## 2021-02-12 MED ORDER — MIDAZOLAM HCL 5 MG/5ML IJ SOLN
INTRAMUSCULAR | Status: DC | PRN
  Administered 2021-02-12: 2 mg via INTRAVENOUS

## 2021-02-12 MED ORDER — CHLORHEXIDINE GLUCONATE 0.12 % MT SOLN: 15.0000 mL | Freq: Once | OROMUCOSAL | Status: AC

## 2021-02-12 MED ORDER — ACETAMINOPHEN 10 MG/ML IV SOLN
INTRAVENOUS | Status: AC
  Filled 2021-02-12: qty 100

## 2021-02-12 MED ORDER — KETAMINE HCL 50 MG/5ML IJ SOSY
PREFILLED_SYRINGE | INTRAMUSCULAR | Status: AC
  Filled 2021-02-12: qty 5

## 2021-02-12 MED ORDER — ROCURONIUM BROMIDE 10 MG/ML (PF) SYRINGE
PREFILLED_SYRINGE | INTRAVENOUS | Status: DC | PRN
  Administered 2021-02-12: 20 mg via INTRAVENOUS
  Administered 2021-02-12: 10 mg via INTRAVENOUS
  Administered 2021-02-12: 80 mg via INTRAVENOUS

## 2021-02-12 MED ORDER — BUPIVACAINE-EPINEPHRINE 0.25% -1:200000 IJ SOLN
INTRAMUSCULAR | Status: DC | PRN
  Administered 2021-02-12: 4 mL

## 2021-02-12 MED ORDER — ROCURONIUM BROMIDE 10 MG/ML (PF) SYRINGE
PREFILLED_SYRINGE | INTRAVENOUS | Status: AC
  Filled 2021-02-12: qty 10

## 2021-02-12 MED ORDER — SURGIFLO WITH THROMBIN (HEMOSTATIC MATRIX KIT) OPTIME
TOPICAL | Status: DC | PRN
  Administered 2021-02-12: 1 via TOPICAL

## 2021-02-12 MED ORDER — ONDANSETRON HCL 4 MG/2ML IJ SOLN
INTRAMUSCULAR | Status: DC | PRN
  Administered 2021-02-12: 4 mg via INTRAVENOUS

## 2021-02-12 MED ORDER — ORAL CARE MOUTH RINSE: 15.0000 mL | Freq: Once | OROMUCOSAL | Status: AC

## 2021-02-12 MED ORDER — LIDOCAINE 2% (20 MG/ML) 5 ML SYRINGE
INTRAMUSCULAR | Status: AC
  Filled 2021-02-12: qty 5

## 2021-02-12 MED ORDER — SUGAMMADEX SODIUM 200 MG/2ML IV SOLN
INTRAVENOUS | Status: DC | PRN
Start: 2021-02-12 — End: 2021-02-12
  Administered 2021-02-12 (×3): 100 mg via INTRAVENOUS

## 2021-02-12 MED ORDER — DEXAMETHASONE SODIUM PHOSPHATE 10 MG/ML IJ SOLN
INTRAMUSCULAR | Status: DC | PRN
  Administered 2021-02-12: 10 mg via INTRAVENOUS

## 2021-02-12 MED ORDER — HYDROCODONE-ACETAMINOPHEN 5-325 MG PO TABS: 1.0000 | ORAL_TABLET | Freq: Four times a day (QID) | ORAL | 0 refills | Status: AC | PRN

## 2021-02-12 MED ORDER — ONDANSETRON HCL 4 MG/2ML IJ SOLN
INTRAMUSCULAR | Status: AC
  Filled 2021-02-12: qty 2

## 2021-02-12 MED ORDER — SCOPOLAMINE 1 MG/3DAYS TD PT72
1.0000 | MEDICATED_PATCH | TRANSDERMAL | Status: DC
  Administered 2021-02-12: 1.5 mg via TRANSDERMAL

## 2021-02-12 MED ORDER — ACETAMINOPHEN 10 MG/ML IV SOLN
INTRAVENOUS | Status: DC | PRN
  Administered 2021-02-12: 1000 mg via INTRAVENOUS

## 2021-02-12 MED ORDER — FENTANYL CITRATE (PF) 250 MCG/5ML IJ SOLN
INTRAMUSCULAR | Status: AC
  Filled 2021-02-12: qty 5

## 2021-02-12 MED ORDER — CEFAZOLIN SODIUM-DEXTROSE 2-4 GM/100ML-% IV SOLN
2.0000 g | INTRAVENOUS | Status: AC
  Administered 2021-02-12: 2 g via INTRAVENOUS

## 2021-02-12 MED ORDER — FENTANYL CITRATE (PF) 250 MCG/5ML IJ SOLN
INTRAMUSCULAR | Status: DC | PRN
  Administered 2021-02-12: 50 ug via INTRAVENOUS
  Administered 2021-02-12: 100 ug via INTRAVENOUS

## 2021-02-12 MED ORDER — THROMBIN 20000 UNITS EX SOLR: CUTANEOUS | Status: DC | PRN

## 2021-02-12 SURGICAL SUPPLY — 66 items
BAG COUNTER SPONGE SURGICOUNT (BAG) ×2 IMPLANT
BENZOIN TINCTURE PRP APPL 2/3 (GAUZE/BANDAGES/DRESSINGS) ×2 IMPLANT
BIT DRILL NEURO 2X3.1 SFT TUCH (MISCELLANEOUS) ×1 IMPLANT
BIT DRILL SRG 14X2.2XFLT CHK (BIT) ×1 IMPLANT
BIT DRL SRG 14X2.2XFLT CHK (BIT) ×1
BLADE CLIPPER SURG (BLADE) ×2 IMPLANT
BLADE SURG 15 STRL LF DISP TIS (BLADE) ×1 IMPLANT
BLADE SURG 15 STRL SS (BLADE) ×1
BONE VIVIGEN FORMABLE 1.3CC (Bone Implant) ×2 IMPLANT
CAGE NANOLOCK ENDOSKELETON 7 S (Cage) ×2 IMPLANT
CARTRIDGE OIL MAESTRO DRILL (MISCELLANEOUS) ×1 IMPLANT
CORD BIPOLAR FORCEPS 12FT (ELECTRODE) ×2 IMPLANT
COVER SURGICAL LIGHT HANDLE (MISCELLANEOUS) ×2 IMPLANT
DIFFUSER DRILL AIR PNEUMATIC (MISCELLANEOUS) ×2 IMPLANT
DRAIN JACKSON RD 7FR 3/32 (WOUND CARE) IMPLANT
DRAPE C-ARM 42X72 X-RAY (DRAPES) ×2 IMPLANT
DRAPE POUCH INSTRU U-SHP 10X18 (DRAPES) ×2 IMPLANT
DRAPE SURG 17X23 STRL (DRAPES) ×6 IMPLANT
DRILL BIT SKYLINE 14MM (BIT) ×1
DRILL NEURO 2X3.1 SOFT TOUCH (MISCELLANEOUS) ×2
DURAPREP 26ML APPLICATOR (WOUND CARE) ×2 IMPLANT
ELECT COATED BLADE 2.86 ST (ELECTRODE) ×2 IMPLANT
ELECT REM PT RETURN 9FT ADLT (ELECTROSURGICAL) ×2
ELECTRODE REM PT RTRN 9FT ADLT (ELECTROSURGICAL) ×1 IMPLANT
EVACUATOR SILICONE 100CC (DRAIN) IMPLANT
GAUZE 4X4 16PLY ~~LOC~~+RFID DBL (SPONGE) ×2 IMPLANT
GAUZE SPONGE 4X4 12PLY STRL (GAUZE/BANDAGES/DRESSINGS) ×2 IMPLANT
GLOVE SRG 8 PF TXTR STRL LF DI (GLOVE) ×2 IMPLANT
GLOVE SURG ENC MOIS LTX SZ6.5 (GLOVE) ×2 IMPLANT
GLOVE SURG ENC MOIS LTX SZ8 (GLOVE) ×4 IMPLANT
GLOVE SURG UNDER POLY LF SZ7 (GLOVE) ×6 IMPLANT
GLOVE SURG UNDER POLY LF SZ8 (GLOVE) ×2
GOWN STRL REUS W/ TWL LRG LVL3 (GOWN DISPOSABLE) ×2 IMPLANT
GOWN STRL REUS W/ TWL XL LVL3 (GOWN DISPOSABLE) ×2 IMPLANT
GOWN STRL REUS W/TWL LRG LVL3 (GOWN DISPOSABLE) ×2
GOWN STRL REUS W/TWL XL LVL3 (GOWN DISPOSABLE) ×2
IV CATH 14GX2 1/4 (CATHETERS) ×2 IMPLANT
KIT BASIN OR (CUSTOM PROCEDURE TRAY) ×2 IMPLANT
KIT TURNOVER KIT B (KITS) ×2 IMPLANT
MANIFOLD NEPTUNE II (INSTRUMENTS) ×2 IMPLANT
NEEDLE PRECISIONGLIDE 27X1.5 (NEEDLE) ×2 IMPLANT
NEEDLE SPNL 20GX3.5 QUINCKE YW (NEEDLE) ×2 IMPLANT
NS IRRIG 1000ML POUR BTL (IV SOLUTION) ×2 IMPLANT
OIL CARTRIDGE MAESTRO DRILL (MISCELLANEOUS) ×2
PACK ORTHO CERVICAL (CUSTOM PROCEDURE TRAY) ×2 IMPLANT
PENCIL BUTTON HOLSTER BLD 10FT (ELECTRODE) ×2 IMPLANT
PIN DISTRACTION 14 (PIN) ×4 IMPLANT
PLATE SKYLINE 12MM (Plate) ×2 IMPLANT
POSITIONER HEAD DONUT 9IN (MISCELLANEOUS) ×2 IMPLANT
SCREW SKYLINE VAR OS 14MM (Screw) ×8 IMPLANT
SPONGE SURGIFOAM ABS GEL 100 (HEMOSTASIS) ×2 IMPLANT
STRIP CLOSURE SKIN 1/2X4 (GAUZE/BANDAGES/DRESSINGS) ×2 IMPLANT
SURGIFLO W/THROMBIN 8M KIT (HEMOSTASIS) ×2 IMPLANT
SUT MNCRL AB 4-0 PS2 18 (SUTURE) ×2 IMPLANT
SUT SILK 4 0 (SUTURE)
SUT SILK 4-0 18XBRD TIE 12 (SUTURE) IMPLANT
SUT VIC AB 2-0 CT2 18 VCP726D (SUTURE) ×2 IMPLANT
SYR BULB IRRIG 60ML STRL (SYRINGE) ×2 IMPLANT
SYR CONTROL 10ML LL (SYRINGE) ×4 IMPLANT
TAPE CLOTH 4X10 WHT NS (GAUZE/BANDAGES/DRESSINGS) ×2 IMPLANT
TAPE CLOTH SURG 4X10 WHT LF (GAUZE/BANDAGES/DRESSINGS) ×2 IMPLANT
TAPE UMBILICAL COTTON 1/8X30 (MISCELLANEOUS) ×2 IMPLANT
TOWEL GREEN STERILE (TOWEL DISPOSABLE) ×2 IMPLANT
TOWEL GREEN STERILE FF (TOWEL DISPOSABLE) ×2 IMPLANT
WATER STERILE IRR 1000ML POUR (IV SOLUTION) ×2 IMPLANT
YANKAUER SUCT BULB TIP NO VENT (SUCTIONS) ×2 IMPLANT

## 2021-02-12 NOTE — Anesthesia Procedure Notes (Signed)
Procedure Name: Intubation Date/Time: 02/12/2021 1:09 PM Performed by: Maude Leriche, CRNA Pre-anesthesia Checklist: Patient identified, Emergency Drugs available, Suction available and Patient being monitored Patient Re-evaluated:Patient Re-evaluated prior to induction Oxygen Delivery Method: Circle system utilized Preoxygenation: Pre-oxygenation with 100% oxygen Induction Type: IV induction Ventilation: Mask ventilation without difficulty Laryngoscope Size: Glidescope and 3 Grade View: Grade I Tube type: Oral Tube size: 7.5 mm Number of attempts: 1 Airway Equipment and Method: Stylet Placement Confirmation: ETT inserted through vocal cords under direct vision, positive ETCO2 and breath sounds checked- equal and bilateral Secured at: 24 cm Tube secured with: Tape Dental Injury: Teeth and Oropharynx as per pre-operative assessment  Comments: Elective glidescope for c-spine stabilization

## 2021-02-12 NOTE — H&P (Signed)
     PREOPERATIVE H&P  Chief Complaint: Left arm pain  HPI: Edward Weiss is a 55 y.o. 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  Patient has failed multiple forms of conservative care and continues to have pain (see office notes for additional details regarding the patient's full course of treatment)  Past Medical History:  Diagnosis Date   Allergy    Asthma    Hx of adenomatous polyp of colon 04/01/2018   Past Surgical History:  Procedure Laterality Date   ANKLE SURGERY Right    HERNIA REPAIR     KNEE SURGERY Right    TONSILLECTOMY     Social History   Socioeconomic History   Marital status: Married    Spouse name: Not on file   Number of children: Not on file   Years of education: Not on file   Highest education level: Not on file  Occupational History   Not on file  Tobacco Use   Smoking status: Never   Smokeless tobacco: Never  Vaping Use   Vaping Use: Never used  Substance and Sexual Activity   Alcohol use: Yes    Comment: Socially mostly beer   Drug use: Never   Sexual activity: Not on file  Other Topics Concern   Not on file  Social History Narrative   Not on file   Social Determinants of Health   Financial Resource Strain: Not on file  Food Insecurity: Not on file  Transportation Needs: Not on file  Physical Activity: Not on file  Stress: Not on file  Social Connections: Not on file   Family History  Problem Relation Age of Onset   Meniere's disease Mother    Hypertension Father    Colon cancer Neg Hx    Esophageal cancer Neg Hx    Rectal cancer Neg Hx    Stomach cancer Neg Hx    Allergies  Allergen Reactions   Shellfish Allergy Shortness Of Breath   Prior to Admission medications   Medication Sig Start Date End Date Taking? Authorizing Provider  fexofenadine (ALLEGRA) 180 MG tablet Take 180 mg by mouth daily as needed for allergies.   Yes [provider]   fluticasone (FLONASE) 50 MCG/ACT nasal spray Place 2 sprays into both nostrils daily as needed for allergies or rhinitis.   Yes [provider]  montelukast (SINGULAIR) 10 MG tablet Take 10 mg by mouth daily as needed (allergies).   Yes [provider]     All other systems have been reviewed and were otherwise negative with the exception of those mentioned in the HPI and as above.  Physical Exam: There were no vitals filed for this visit.  There is no height or weight on file to calculate BMI.  General: Alert, no acute distress Cardiovascular: No pedal edema Respiratory: No cyanosis, no use of accessory musculature Skin: No lesions in the area of chief complaint Neurologic: Sensation intact distally Psychiatric: Patient is competent for consent with normal mood and affect Lymphatic: No axillary or cervical lymphadenopathy  Assessment/Plan: LEFT C8 RADICULOPATHY Plan for Procedure(s): ANTERIOR CERVICAL DECOMPRESSION FUSION CERVICAL 7- THORACIC 1 WITH INSTRUMENTATION AND ALLOGRAFT   Norva Karvonen, MD 02/12/2021 7:24 AM

## 2021-02-12 NOTE — Transfer of Care (Signed)
Immediate Anesthesia Transfer of Care Note  Patient: Edward Weiss  Procedure(s) Performed: ANTERIOR CERVICAL DECOMPRESSION FUSION CERVICAL SEVEN- THORACIC ONE WITH INSTRUMENTATION AND ALLOGRAFT (Spine Cervical)  Patient Location: PACU  Anesthesia Type:General  Level of Consciousness: awake, alert  and oriented  Airway & Oxygen Therapy: Patient Spontanous Breathing and Patient connected to face mask oxygen  Post-op Assessment: Report given to RN and Post -op Vital signs reviewed and stable  Post vital signs: Reviewed and stable  Last Vitals:  Vitals Value Taken Time  BP 146/97 02/12/21 1515  Temp    Pulse 65 02/12/21 1517  Resp 12 02/12/21 1517  SpO2 100 % 02/12/21 1517  Vitals shown include unvalidated device data.  Last Pain:  Vitals:   02/12/21 1013  TempSrc:   PainSc: 0-No pain      Patients Stated Pain Goal: 1 (33/54/56 2563)  Complications: No notable events documented.

## 2021-02-12 NOTE — Anesthesia Postprocedure Evaluation (Signed)
Anesthesia Post Note  Patient: YVETTE ROARK  Procedure(s) Performed: ANTERIOR CERVICAL DECOMPRESSION FUSION CERVICAL SEVEN- THORACIC ONE WITH INSTRUMENTATION AND ALLOGRAFT (Spine Cervical)     Patient location during evaluation: PACU Anesthesia Type: General Level of consciousness: awake and alert Pain management: pain level controlled Vital Signs Assessment: post-procedure vital signs reviewed and stable Respiratory status: spontaneous breathing, nonlabored ventilation, respiratory function stable and patient connected to nasal cannula oxygen Cardiovascular status: blood pressure returned to baseline and stable Postop Assessment: no apparent nausea or vomiting Anesthetic complications: no   No notable events documented.  Last Vitals:  Vitals:   02/12/21 1615 02/12/21 1645  BP: (!) 147/90 (!) 149/82  Pulse: 61 64  Resp: 12 16  Temp: 36.6 C   SpO2: 96% 100%    Last Pain:  Vitals:   02/12/21 1615  TempSrc:   PainSc: 3                  Falon Huesca P Jihan Mellette

## 2021-02-12 NOTE — Op Note (Signed)
PATIENT NAME: Edward Weiss   MEDICAL RECORD NO.:   [id number]    DATE OF BIRTH: Dec 22, 1965   DATE OF PROCEDURE: 02/12/2021                                OPERATIVE REPORT     PREOPERATIVE DIAGNOSES: 1. Left-sided C8 radiculopathy 2. Spinal stenosis, C7/T1   POSTOPERATIVE DIAGNOSES: 1. Left-sided C8 radiculopathy 2. Spinal stenosis, C7/T1   PROCEDURE: 1. Anterior cervical decompression and fusion C7/T1 2. Placement of anterior instrumentation, C7/T1 3. Insertion of interbody device x1 (64mm parallel Titan intervertebral spacer). 4. Intraoperative use of fluoroscopy 5. Use of morselized allograft - ViviGen   SURGEON:  Phylliss Bob, MD   ASSISTANT:  Pricilla Holm, PA-C.   ANESTHESIA:  General endotracheal anesthesia.   COMPLICATIONS:  None.   DISPOSITION:  Stable.   ESTIMATED BLOOD LOSS:  Minimal.   INDICATIONS FOR SURGERY:  Briefly, Mr. Brod is a pleasant 55 -year- old male, who did present to me with severe pain in his neck and left arm. His left arm pain was very much in the distribution of the C8 nerve. The patient's MRI did reveal the findings noted above.  Given the ongoing rather debilitating pain and lack of improvement with appropriate treatment measures, we did discuss proceeding with the procedure noted above.  The patient was fully aware of the risks and limitations of surgery as outlined in my preoperative note.   OPERATIVE DETAILS:  On 02/12/2021 , the patient was brought to surgery and general endotracheal anesthesia was administered.  The patient was placed supine on the hospital bed. The neck was gently extended.  All bony prominences were meticulously padded.  The neck was prepped and draped in the usual sterile fashion.  At this point, I did make a left-sided transverse incision.  The platysma was incised.  A Smith-Robinson approach was used and the anterior spine was identified. A self-retaining retractor was placed.  I then subperiosteally  exposed the vertebral bodies from C7 to T1.  Caspar pins were then placed into the C7 and T1 vertebral bodies and distraction was applied.  A thorough and complete C7/T1 intervertebral diskectomy was performed.  The posterior longitudinal ligament was identified and entered using a nerve hook.  I then used #1 followed by #2 Kerrison to perform a thorough and complete intervertebral diskectomy.  The spinal canal was thoroughly decompressed, as was the left neuroforamen.  The endplates were then prepared and the appropriate-sized intervertebral spacer was then packed with ViviGen and tamped into position in the usual fashion. The Caspar pins  then were removed and bone wax was placed in their place.  The appropriate-sized anterior cervical plate was placed over the anterior spine.  14 mm variable angle screws were placed, 2 in each vertebral body from C7-T1 for a total of 4 vertebral body screws.  The screws were then locked to the plate using the Cam locking mechanism.  I was very pleased with the final fluoroscopic images.  The wound was then irrigated.  The wound was then explored for any undue bleeding and there was no bleeding noted. The wound was then closed in layers using 2-0 Vicryl, followed by 4-0 Monocryl.  Benzoin and Steri-Strips were applied, followed by sterile dressing.  All instrument counts were correct at the termination of the procedure.   Of note, Pricilla Holm, PA-C, was my assistant throughout surgery, and did aid in retraction,  placement of the hardware, suctioning, and closure from start to finish.     Phylliss Bob, MD

## 2021-02-12 NOTE — Anesthesia Preprocedure Evaluation (Signed)
Anesthesia Evaluation  Patient identified by MRN, date of birth, ID band Patient awake    Reviewed: Allergy & Precautions, NPO status , Patient's Chart, lab work & pertinent test results  Airway Mallampati: II  TM Distance: >3 FB Neck ROM: Limited    Dental no notable dental hx.    Pulmonary asthma ,    Pulmonary exam normal        Cardiovascular negative cardio ROS   Rhythm:Regular Rate:Normal     Neuro/Psych negative neurological ROS  negative psych ROS   GI/Hepatic negative GI ROS, Neg liver ROS,   Endo/Other  negative endocrine ROS  Renal/GU negative Renal ROS  negative genitourinary   Musculoskeletal Cervical radiculopathy   Abdominal Normal abdominal exam  (+)   Peds  Hematology negative hematology ROS (+)   Anesthesia Other Findings   Reproductive/Obstetrics                             Anesthesia Physical Anesthesia Plan  ASA: 2  Anesthesia Plan: General   Post-op Pain Management:    Induction: Intravenous  PONV Risk Score and Plan: 2 and Ondansetron, Dexamethasone, Midazolam and Treatment may vary due to age or medical condition  Airway Management Planned: Mask and Oral ETT  Additional Equipment: None  Intra-op Plan:   Post-operative Plan: Extubation in OR  Informed Consent: I have reviewed the patients History and Physical, chart, labs and discussed the procedure including the risks, benefits and alternatives for the proposed anesthesia with the patient or authorized representative who has indicated his/her understanding and acceptance.     Dental advisory given  Plan Discussed with: CRNA  Anesthesia Plan Comments: (Lab Results      Component                Value               Date                      WBC                      5.5                 02/04/2021                HGB                      14.1                02/04/2021                HCT                       43.7                02/04/2021                MCV                      84.5                02/04/2021                PLT                      256  02/04/2021           Lab Results      Component                Value               Date                      NA                       136                 02/04/2021                K                        3.8                 02/04/2021                CO2                      26                  02/04/2021                GLUCOSE                  117 (H)             02/04/2021                BUN                      18                  02/04/2021                CREATININE               1.05                02/04/2021                CALCIUM                  9.1                 02/04/2021                GFRNONAA                 >60                 02/04/2021          )        Anesthesia Quick Evaluation

## 2021-02-13 ENCOUNTER — Encounter (HOSPITAL_COMMUNITY): Payer: Self-pay | Admitting: Orthopedic Surgery

## 2021-03-28 DIAGNOSIS — Z9889 Other specified postprocedural states: Secondary | ICD-10-CM | POA: Diagnosis not present

## 2021-05-26 DIAGNOSIS — M542 Cervicalgia: Secondary | ICD-10-CM | POA: Diagnosis not present

## 2021-08-27 DIAGNOSIS — J302 Other seasonal allergic rhinitis: Secondary | ICD-10-CM | POA: Diagnosis not present

## 2021-08-27 DIAGNOSIS — J019 Acute sinusitis, unspecified: Secondary | ICD-10-CM | POA: Diagnosis not present

## 2021-09-01 DIAGNOSIS — R059 Cough, unspecified: Secondary | ICD-10-CM | POA: Diagnosis not present

## 2021-09-01 DIAGNOSIS — R0602 Shortness of breath: Secondary | ICD-10-CM | POA: Diagnosis not present

## 2021-09-01 DIAGNOSIS — R051 Acute cough: Secondary | ICD-10-CM | POA: Diagnosis not present

## 2021-09-01 DIAGNOSIS — J40 Bronchitis, not specified as acute or chronic: Secondary | ICD-10-CM | POA: Diagnosis not present

## 2021-11-11 DIAGNOSIS — K5900 Constipation, unspecified: Secondary | ICD-10-CM | POA: Diagnosis not present

## 2021-11-11 DIAGNOSIS — Z125 Encounter for screening for malignant neoplasm of prostate: Secondary | ICD-10-CM | POA: Diagnosis not present

## 2021-11-11 DIAGNOSIS — E739 Lactose intolerance, unspecified: Secondary | ICD-10-CM | POA: Diagnosis not present

## 2021-11-11 DIAGNOSIS — Z114 Encounter for screening for human immunodeficiency virus [HIV]: Secondary | ICD-10-CM | POA: Diagnosis not present

## 2021-11-11 DIAGNOSIS — Z0000 Encounter for general adult medical examination without abnormal findings: Secondary | ICD-10-CM | POA: Diagnosis not present

## 2021-11-11 DIAGNOSIS — Z8271 Family history of polycystic kidney: Secondary | ICD-10-CM | POA: Diagnosis not present

## 2021-11-11 DIAGNOSIS — B0229 Other postherpetic nervous system involvement: Secondary | ICD-10-CM | POA: Diagnosis not present

## 2021-11-13 ENCOUNTER — Other Ambulatory Visit: Payer: Self-pay | Admitting: Family Medicine

## 2021-11-13 DIAGNOSIS — Z8271 Family history of polycystic kidney: Secondary | ICD-10-CM

## 2021-11-19 ENCOUNTER — Ambulatory Visit
Admission: RE | Admit: 2021-11-19 | Discharge: 2021-11-19 | Disposition: A | Discharge status: Home / Self Care | Payer: BC Managed Care – PPO | Source: Ambulatory Visit | Attending: Family Medicine | Admitting: Family Medicine

## 2021-11-19 DIAGNOSIS — Z8271 Family history of polycystic kidney: Secondary | ICD-10-CM | POA: Diagnosis not present

## 2021-11-25 DIAGNOSIS — Z8271 Family history of polycystic kidney: Secondary | ICD-10-CM | POA: Diagnosis not present

## 2021-11-25 DIAGNOSIS — J309 Allergic rhinitis, unspecified: Secondary | ICD-10-CM | POA: Diagnosis not present

## 2021-11-25 DIAGNOSIS — K5900 Constipation, unspecified: Secondary | ICD-10-CM | POA: Diagnosis not present

## 2021-11-27 DIAGNOSIS — Z0000 Encounter for general adult medical examination without abnormal findings: Secondary | ICD-10-CM | POA: Diagnosis not present

## 2021-11-27 DIAGNOSIS — Z125 Encounter for screening for malignant neoplasm of prostate: Secondary | ICD-10-CM | POA: Diagnosis not present

## 2021-12-08 DIAGNOSIS — Z23 Encounter for immunization: Secondary | ICD-10-CM | POA: Diagnosis not present

## 2021-12-08 DIAGNOSIS — Z0000 Encounter for general adult medical examination without abnormal findings: Secondary | ICD-10-CM | POA: Diagnosis not present

## 2022-01-21 DIAGNOSIS — H6993 Unspecified Eustachian tube disorder, bilateral: Secondary | ICD-10-CM | POA: Diagnosis not present

## 2022-01-21 DIAGNOSIS — Z91013 Allergy to seafood: Secondary | ICD-10-CM | POA: Diagnosis not present

## 2022-01-21 DIAGNOSIS — H9191 Unspecified hearing loss, right ear: Secondary | ICD-10-CM | POA: Diagnosis not present

## 2022-01-21 DIAGNOSIS — H9190 Unspecified hearing loss, unspecified ear: Secondary | ICD-10-CM | POA: Diagnosis not present

## 2022-01-21 DIAGNOSIS — J3089 Other allergic rhinitis: Secondary | ICD-10-CM | POA: Diagnosis not present

## 2022-01-27 DIAGNOSIS — J3089 Other allergic rhinitis: Secondary | ICD-10-CM | POA: Diagnosis not present

## 2022-02-10 DIAGNOSIS — Z23 Encounter for immunization: Secondary | ICD-10-CM | POA: Diagnosis not present

## 2023-11-29 ENCOUNTER — Emergency Department (HOSPITAL_BASED_OUTPATIENT_CLINIC_OR_DEPARTMENT_OTHER)

## 2023-11-29 ENCOUNTER — Other Ambulatory Visit: Payer: Self-pay

## 2023-11-29 ENCOUNTER — Encounter (HOSPITAL_BASED_OUTPATIENT_CLINIC_OR_DEPARTMENT_OTHER): Payer: Self-pay | Admitting: Emergency Medicine

## 2023-11-29 ENCOUNTER — Emergency Department (HOSPITAL_BASED_OUTPATIENT_CLINIC_OR_DEPARTMENT_OTHER)
Admission: EM | Admit: 2023-11-29 | Discharge: 2023-11-29 | Disposition: A | Discharge status: Home / Self Care | Attending: Emergency Medicine | Admitting: Emergency Medicine

## 2023-11-29 DIAGNOSIS — R202 Paresthesia of skin: Secondary | ICD-10-CM | POA: Insufficient documentation

## 2023-11-29 DIAGNOSIS — R0789 Other chest pain: Secondary | ICD-10-CM | POA: Insufficient documentation

## 2023-11-29 DIAGNOSIS — I10 Essential (primary) hypertension: Secondary | ICD-10-CM | POA: Insufficient documentation

## 2023-11-29 DIAGNOSIS — R079 Chest pain, unspecified: Secondary | ICD-10-CM | POA: Diagnosis present

## 2023-11-29 LAB — CBC WITH DIFFERENTIAL/PLATELET
Abs Immature Granulocytes: 0.01 K/uL (ref 0.00–0.07)
Basophils Absolute: 0 K/uL (ref 0.0–0.1)
Basophils Relative: 1 %
Eosinophils Absolute: 0.3 K/uL (ref 0.0–0.5)
Eosinophils Relative: 6 %
HCT: 39.5 % (ref 39.0–52.0)
Hemoglobin: 13.3 g/dL (ref 13.0–17.0)
Immature Granulocytes: 0 %
Lymphocytes Relative: 43 %
Lymphs Abs: 2 K/uL (ref 0.7–4.0)
MCH: 27.6 pg (ref 26.0–34.0)
MCHC: 33.7 g/dL (ref 30.0–36.0)
MCV: 82 fL (ref 80.0–100.0)
Monocytes Absolute: 0.4 K/uL (ref 0.1–1.0)
Monocytes Relative: 8 %
Neutro Abs: 2 K/uL (ref 1.7–7.7)
Neutrophils Relative %: 42 %
Platelets: 224 K/uL (ref 150–400)
RBC: 4.82 MIL/uL (ref 4.22–5.81)
RDW: 13.5 % (ref 11.5–15.5)
WBC: 4.7 K/uL (ref 4.0–10.5)
nRBC: 0 % (ref 0.0–0.2)

## 2023-11-29 LAB — BASIC METABOLIC PANEL WITH GFR
Anion gap: 10 (ref 5–15)
BUN: 13 mg/dL (ref 6–20)
CO2: 27 mmol/L (ref 22–32)
Calcium: 9.1 mg/dL (ref 8.9–10.3)
Chloride: 104 mmol/L (ref 98–111)
Creatinine, Ser: 0.96 mg/dL (ref 0.61–1.24)
GFR, Estimated: >60 mL/min (ref >60)
Glucose, Bld: 99 mg/dL (ref 70–99)
Potassium: 4 mmol/L (ref 3.5–5.1)
Sodium: 140 mmol/L (ref 135–145)

## 2023-11-29 LAB — TROPONIN T, HIGH SENSITIVITY
Troponin T High Sensitivity: <15 ng/L (ref 0–19)
Troponin T High Sensitivity: <15 ng/L (ref 0–19)

## 2023-11-29 NOTE — ED Provider Notes (Signed)
 I received the patient in signout.  Patient presents with somewhat atypical episodic chest pain.  Initial workup is reassuring.  Plan is for reevaluation after delta troponin for disposition.  Physical Exam  BP 134/85   Pulse (!) 56   Temp 97.8 F (36.6 C) (Oral)   Resp 13   Ht 5' 10 (1.778 m)   Wt 101.6 kg   SpO2 96%   BMI 32.14 kg/m   Physical Exam General: No acute distress  Procedures  Procedures  ED Course / MDM   Clinical Course as of 11/29/23 0714  Mon Nov 29, 2023  0530 CBC is normal.  [CS]  0549 BMP and trop #1 are normal.  [CS]  0554 I personally viewed the images from radiology studies and agree with radiologist interpretation: CXR is clear [CS]  0650 Patient reports several brief episodes of pain, does not correlate with any abnormal rhythm or PVC on monitor. Awaiting delta trop.  [CS]  0710 Care signed out at shift change pending delta trop.  [CS]    Clinical Course User Index [CS] Roselyn Carlin NOVAK, MD   Medical Decision Making Amount and/or Complexity of Data Reviewed Labs: ordered. Radiology: ordered.   The patient second troponin here is negative.  He is discharged with return precautions with PCP follow-up       Ula Prentice SAUNDERS, MD 11/29/23 [phone]

## 2023-11-29 NOTE — ED Triage Notes (Signed)
 Patient coming to ED for evaluation of chest pain.  Reports pain is located on L side.  Started prior to going to bed last night.  Woke with increasing discomfort and tingling in L arm.  No reports of cardiac hx.  No SHOB.  Was recently told he BP was a little high.

## 2023-11-29 NOTE — Discharge Instructions (Addendum)
 Your workup today was reassuring.  Your heart tests are unremarkable.  Please follow-up with your primary care doctor to discuss further workup as needed as an outpatient.  Return to the ER or call your doctor for worsening symptoms.

## 2023-11-29 NOTE — ED Provider Notes (Signed)
 [facility]  Provider Note  CSN: [id number] Arrival date & time: 11/29/23 0448  History Chief Complaint  Patient presents with   Chest Pain    Edward Weiss is a 58 y.o. male with history of HTN reports intermittent episodes of chest pain/palpitations since around 2200hrs. Comes and goes every lasts for a few seconds and resolves. He woke up around 0400hrs to use the restroom and noted some tingling in his left fingers and toes and some pressure behind his L eye. No weakness or loss of sensation. No SOB, fever, cough, N/V/D or diaphoresis. No known CAD.    Home Medications Prior to Admission medications   Medication Sig Start Date End Date Taking? Authorizing Provider  fexofenadine (ALLEGRA) 180 MG tablet Take 180 mg by mouth daily as needed for allergies.    [provider]  fluticasone (FLONASE) 50 MCG/ACT nasal spray Place 2 sprays into both nostrils daily as needed for allergies or rhinitis.    [provider]  methocarbamol  (ROBAXIN ) 500 MG tablet Take 1 tablet (500 mg total) by mouth every 6 (six) hours as needed for muscle spasms. 02/12/21   McKenzie, Kayla J, PA-C  montelukast (SINGULAIR) 10 MG tablet Take 10 mg by mouth daily as needed (allergies).    [provider]     Allergies    Shellfish allergy   Review of Systems   Review of Systems Please see HPI for pertinent positives and negatives  Physical Exam BP 134/85   Pulse (!) 56   Temp 97.8 F (36.6 C) (Oral)   Resp 13   Ht 5' 10 (1.778 m)   Wt 101.6 kg   SpO2 96%   BMI 32.14 kg/m   Physical Exam Vitals and nursing note reviewed.  Constitutional:      Appearance: Normal appearance.  HENT:     Head: Normocephalic and atraumatic.     Nose: Nose normal.     Mouth/Throat:     Mouth: Mucous membranes are moist.  Eyes:     Extraocular Movements: Extraocular movements intact.     Conjunctiva/sclera: Conjunctivae normal.   Cardiovascular:     Rate and Rhythm: Normal rate.  Pulmonary:     Effort: Pulmonary effort is normal.     Breath sounds: Normal breath sounds.  Abdominal:     General: Abdomen is flat.     Palpations: Abdomen is soft.     Tenderness: There is no abdominal tenderness.  Musculoskeletal:        General: No swelling. Normal range of motion.     Cervical back: Neck supple.  Skin:    General: Skin is warm and dry.  Neurological:     General: No focal deficit present.     Mental Status: He is alert and oriented to person, place, and time.     Cranial Nerves: No cranial nerve deficit.     Sensory: No sensory deficit.     Motor: No weakness.  Psychiatric:        Mood and Affect: Mood normal.     ED Results / Procedures / Treatments   EKG EKG Interpretation Date/Time:  Monday November 29 2023 04:58:16 EDT Ventricular Rate:  57 PR Interval:  175 QRS Duration:  91 QT Interval:  428 QTC Calculation: 417 R Axis:   28  Text Interpretation: Duplicate Confirmed by Roselyn Dunnings [phone]) on 11/29/2023 5:01:05 AM  Procedures Procedures  Medications Ordered in the ED Medications -  No data to display  Initial Impression and Plan  Patient here with atypical chest discomfort, ultimately came because he developed some tingling in his fingers and toes. He has a normal neuro exam now, no concern for CVA. Will check labs, CXR, monitor in the ED pending results.   ED Course   Clinical Course as of 11/29/23 0711  Mon Nov 29, 2023  0530 CBC is normal.  [CS]  0549 BMP and trop #1 are normal.  [CS]  0554 I personally viewed the images from radiology studies and agree with radiologist interpretation: CXR is clear [CS]  0650 Patient reports several brief episodes of pain, does not correlate with any abnormal rhythm or PVC on monitor. Awaiting delta trop.  [CS]  0710 Care signed out at shift change pending delta trop.  [CS]    Clinical Course User Index [CS] Roselyn Carlin NOVAK, MD     MDM  Rules/Calculators/A&P Medical Decision Making Problems Addressed: Atypical chest pain: acute illness or injury Paresthesia: acute illness or injury  Amount and/or Complexity of Data Reviewed Labs: ordered. Decision-making details documented in ED Course. Radiology: ordered and independent interpretation performed. Decision-making details documented in ED Course. ECG/medicine tests: ordered and independent interpretation performed. Decision-making details documented in ED Course.     Final Clinical Impression(s) / ED Diagnoses Final diagnoses:  Atypical chest pain  Paresthesia    Rx / DC Orders ED Discharge Orders     None        Roselyn Carlin NOVAK, MD 11/29/23 [phone]

## 2024-03-06 ENCOUNTER — Encounter: Admitting: Urology
